# Patient Record
Sex: Female | Born: 1960 | Race: White | Hispanic: No | Marital: Single | Smoking: Never smoker
Health system: Southern US, Community
[De-identification: ages and names within clinical notes are randomized; demographics above are authoritative.]

## PROBLEM LIST (undated history)

## (undated) DIAGNOSIS — F419 Anxiety disorder, unspecified: Secondary | ICD-10-CM

## (undated) DIAGNOSIS — I10 Essential (primary) hypertension: Secondary | ICD-10-CM

## (undated) DIAGNOSIS — F909 Attention-deficit hyperactivity disorder, unspecified type: Secondary | ICD-10-CM

## (undated) DIAGNOSIS — M797 Fibromyalgia: Secondary | ICD-10-CM

## (undated) DIAGNOSIS — K219 Gastro-esophageal reflux disease without esophagitis: Secondary | ICD-10-CM

## (undated) DIAGNOSIS — M1990 Unspecified osteoarthritis, unspecified site: Secondary | ICD-10-CM

## (undated) HISTORY — PX: DILATION AND CURETTAGE OF UTERUS: SHX78

---

## 2005-05-31 ENCOUNTER — Emergency Department: Payer: Self-pay | Admitting: Unknown Physician Specialty

## 2006-05-07 ENCOUNTER — Ambulatory Visit: Payer: Self-pay | Admitting: Family Medicine

## 2007-01-01 ENCOUNTER — Emergency Department: Payer: Self-pay | Admitting: Emergency Medicine

## 2007-01-01 ENCOUNTER — Other Ambulatory Visit: Payer: Self-pay

## 2007-07-12 ENCOUNTER — Ambulatory Visit: Payer: Self-pay | Admitting: Family Medicine

## 2009-04-19 ENCOUNTER — Ambulatory Visit: Payer: Self-pay | Admitting: Family Medicine

## 2012-04-21 ENCOUNTER — Ambulatory Visit: Payer: Self-pay | Admitting: Family Medicine

## 2012-07-30 ENCOUNTER — Ambulatory Visit: Payer: Self-pay | Admitting: Unknown Physician Specialty

## 2013-05-11 ENCOUNTER — Ambulatory Visit: Payer: Self-pay | Admitting: Family Medicine

## 2014-06-01 ENCOUNTER — Ambulatory Visit: Payer: Self-pay | Admitting: Family Medicine

## 2014-07-07 ENCOUNTER — Ambulatory Visit: Payer: Self-pay | Admitting: Family Medicine

## 2015-06-05 ENCOUNTER — Other Ambulatory Visit: Payer: Self-pay | Admitting: Family Medicine

## 2015-06-05 DIAGNOSIS — Z1231 Encounter for screening mammogram for malignant neoplasm of breast: Secondary | ICD-10-CM

## 2015-07-10 ENCOUNTER — Ambulatory Visit
Admission: RE | Admit: 2015-07-10 | Discharge: 2015-07-10 | Disposition: A | Discharge status: Home / Self Care | Payer: BLUE CROSS/BLUE SHIELD | Source: Ambulatory Visit | Attending: Family Medicine | Admitting: Family Medicine

## 2015-07-10 DIAGNOSIS — Z1231 Encounter for screening mammogram for malignant neoplasm of breast: Secondary | ICD-10-CM | POA: Insufficient documentation

## 2016-12-16 ENCOUNTER — Other Ambulatory Visit: Payer: Self-pay | Admitting: Family Medicine

## 2016-12-16 DIAGNOSIS — Z1231 Encounter for screening mammogram for malignant neoplasm of breast: Secondary | ICD-10-CM

## 2017-01-06 ENCOUNTER — Ambulatory Visit
Admission: RE | Admit: 2017-01-06 | Discharge: 2017-01-06 | Disposition: A | Discharge status: Home / Self Care | Payer: Managed Care, Other (non HMO) | Source: Ambulatory Visit | Attending: Family Medicine | Admitting: Family Medicine

## 2017-01-06 DIAGNOSIS — Z1231 Encounter for screening mammogram for malignant neoplasm of breast: Secondary | ICD-10-CM | POA: Insufficient documentation

## 2017-04-16 ENCOUNTER — Encounter: Payer: Self-pay | Admitting: Emergency Medicine

## 2017-04-16 ENCOUNTER — Ambulatory Visit
Admission: EM | Admit: 2017-04-16 | Discharge: 2017-04-16 | Disposition: A | Discharge status: Home / Self Care | Payer: Worker's Compensation | Attending: Family Medicine | Admitting: Family Medicine

## 2017-04-16 DIAGNOSIS — S0990XA Unspecified injury of head, initial encounter: Secondary | ICD-10-CM | POA: Diagnosis not present

## 2017-04-16 HISTORY — DX: Attention-deficit hyperactivity disorder, unspecified type: F90.9

## 2017-04-16 HISTORY — DX: Essential (primary) hypertension: I10

## 2017-04-16 HISTORY — DX: Anxiety disorder, unspecified: F41.9

## 2017-04-16 MED ORDER — TETANUS-DIPHTH-ACELL PERTUSSIS 5-2.5-18.5 LF-MCG/0.5 IM SUSP
0.5000 mL | Freq: Once | INTRAMUSCULAR | Status: AC
  Administered 2017-04-16: 0.5 mL via INTRAMUSCULAR

## 2017-04-16 NOTE — ED Triage Notes (Signed)
Patient in today due to hitting head at work today. Patient bent down and when came up she hit head on the corner of the counter. Last tetanus ~7 years ago.

## 2017-04-16 NOTE — ED Provider Notes (Signed)
[facility]    CSN: [id number] Arrival date & time: 04/16/17  1710  History   Chief Complaint Chief Complaint  Patient presents with  . Head Laceration   HPI  56 year old female presented to the head injury.  Patient was at work earlier this afternoon and hit her head on a corner countertop. Occurred at 2:15 this afternoon. She had some bleeding but was controlled. Her tetanus is not up-to-date and her work sent her over for evaluation. She has no complaints at this time. No headache no vision changes no reports of loss of consciousness. She states that she feels well. She states that she just needs her tetanus and would like to go home. No medications taken. No other complaints at this time.  Past Medical History:  Diagnosis Date  . ADHD   . Anxiety   . Hypertension    History reviewed. No pertinent surgical history.  OB History    No data available     Home Medications    Prior to Admission medications   Medication Sig Start Date End Date Taking? Authorizing Provider  ALPRAZolam Prudy Feeler) 0.25 MG tablet Take 0.25 mg by mouth daily as needed for anxiety.   Yes [provider]  amphetamine-dextroamphetamine (ADDERALL XR) 30 MG 24 hr capsule Take 30 mg by mouth daily.   Yes [provider]  atenolol (TENORMIN) 50 MG tablet Take 50 mg by mouth daily.   Yes [provider]   Family History Family History  Problem Relation Age of Onset  . Breast cancer Maternal Aunt 70  . Cancer Mother        leukemia  . Hypertension Father   . CAD Father    Social History Social History  Substance Use Topics  . Smoking status: Never Smoker  . Smokeless tobacco: Never Used  . Alcohol use No   Allergies   Ibuprofen   Review of Systems Review of Systems  HENT:       Head injury.  All other systems reviewed and are negative. Physical Exam Triage Vital Signs ED Triage Vitals  Enc Vitals Group     BP 04/16/17 1742 130/80     Pulse Rate  04/16/17 1742 69     Resp 04/16/17 1742 16     Temp 04/16/17 1742 97.9 F (36.6 C)     Temp Source 04/16/17 1742 Oral     SpO2 04/16/17 1742 100 %     Weight 04/16/17 1742 155 lb 3.2 oz (70.4 kg)     Height 04/16/17 1742 5\' 4"  (1.626 m)     Head Circumference --      Peak Flow --      Pain Score 04/16/17 1743 2     Pain Loc --      Pain Edu? --      Excl. in GC? --    Updated Vital Signs BP 130/80 (BP Location: Left Arm)   Pulse 69   Temp 97.9 F (36.6 C) (Oral)   Resp 16   Ht 5\' 4"  (1.626 m)   Wt 155 lb 3.2 oz (70.4 kg)   SpO2 100%   BMI 26.64 kg/m     Physical Exam  Constitutional: She is oriented to person, place, and time. She appears well-developed and well-nourished. No distress.  HENT:  Head: Normocephalic.  Nose: Nose normal.  Mouth/Throat: Oropharynx is clear and moist. No oropharyngeal exudate.  Normal TM's bilaterally.  Patient has a small superficial cut. No bleeding currently.  Eyes: Conjunctivae are normal. No scleral icterus.  Neck: Neck supple. No thyromegaly present.  Cardiovascular: Normal rate and regular rhythm.   No murmur heard. Pulmonary/Chest: Effort normal and breath sounds normal. She has no wheezes. She has no rales.  Abdominal: Soft. She exhibits no distension. There is no tenderness. There is no rebound and no guarding.  Musculoskeletal: Normal range of motion. She exhibits no edema.  Lymphadenopathy:    She has no cervical adenopathy.  Neurological: She is alert and oriented to person, place, and time.  Skin: Skin is warm and dry. No rash noted.  Psychiatric: She has a normal mood and affect.  Vitals reviewed.  UC Treatments / Results  Labs (all labs ordered are listed, but only abnormal results are displayed) Labs Reviewed - No data to display  EKG  EKG Interpretation None       Radiology No results found.  Procedures Procedures (including critical care time)  Medications Ordered in UC Medications  Tdap (BOOSTRIX)  injection 0.5 mL (0.5 mLs Intramuscular Given 04/16/17 1757)     Initial Impression / Assessment and Plan / UC Course  I have reviewed the triage vital signs and the nursing notes.  Pertinent labs & imaging results that were available during my care of the patient were reviewed by me and considered in my medical decision making (see chart for details).  56 year old female presented with a minor head injury. No red flags. No signs or symptoms of concussion. Her small cut is very superficial and is not bleeding. Well approximated. Does not need sutures. Tdap given today. Over-the-counter Tylenol as needed.  Final Clinical Impressions(s) / UC Diagnoses   Final diagnoses:  Minor head injury, initial encounter   New Prescriptions Discharge Medication List as of 04/16/2017  6:11 PM     Controlled Substance Prescriptions [place] Controlled Substance Registry consulted? Not Applicable   Tommie SamsCook, Maiya Kates G, DO 04/16/17 Paulo [id number]

## 2017-04-16 NOTE — Discharge Instructions (Signed)
Tylenol as needed.   Take care  Dr. Maylin Freeburg  

## 2017-09-15 ENCOUNTER — Other Ambulatory Visit: Payer: Self-pay

## 2017-09-15 ENCOUNTER — Encounter
Admission: RE | Admit: 2017-09-15 | Discharge: 2017-09-15 | Disposition: A | Discharge status: Home / Self Care | Payer: BLUE CROSS/BLUE SHIELD | Source: Ambulatory Visit | Attending: Orthopedic Surgery | Admitting: Orthopedic Surgery

## 2017-09-15 HISTORY — DX: Fibromyalgia: M79.7

## 2017-09-15 HISTORY — DX: Gastro-esophageal reflux disease without esophagitis: K21.9

## 2017-09-15 HISTORY — DX: Unspecified osteoarthritis, unspecified site: M19.90

## 2017-09-15 NOTE — Patient Instructions (Signed)
Your procedure is scheduled on: 09-22-17 TUESDAY Report to Same Day Surgery 2nd floor medical mall [facility] Entrance-take elevator on left to 2nd floor.  Check in with surgery information desk.) To find out your arrival time please call [phone] between 1PM - 3PM on 09-21-17 MONDAY  Remember: Instructions that are not followed completely may result in serious medical risk, up to and including death, or upon the discretion of your surgeon and anesthesiologist your surgery may need to be rescheduled.    _x___ 1. Do not eat food after midnight the night before your procedure. NO GUM OR CANDY AFTER MIDNIGHT.  You may drink clear liquids up to 2 hours before you are scheduled to arrive at the hospital for your procedure.  Do not drink clear liquids within 2 hours of your scheduled arrival to the hospital.  Clear liquids include  --Water or Apple juice without pulp  --Clear carbohydrate beverage such as ClearFast or Gatorade  --Black Coffee or Clear Tea (No milk, no creamers, do not add anything to the coffee or Tea     __x__ 2. No Alcohol for 24 hours before or after surgery.   __x__3. No Smoking or e-cigarettes for 24 prior to surgery.  Do not use any chewable tobacco products for at least 6 hour prior to surgery   ____  4. Bring all medications with you on the day of surgery if instructed.    __x__ 5. Notify your doctor if there is any change in your medical condition     (cold, fever, infections).    x___6. On the morning of surgery brush your teeth with toothpaste and water.  You may rinse your mouth with mouth wash if you wish.  Do not swallow any toothpaste or mouthwash.   Do not wear jewelry, make-up, hairpins, clips or nail polish.  Do not wear lotions, powders, or perfumes. You may wear deodorant.  Do not shave 48 hours prior to surgery. Men may shave face and neck.  Do not bring valuables to the hospital.    [facility] is not responsible for any belongings or  valuables.               Contacts, dentures or bridgework may not be worn into surgery.  Leave your suitcase in the car. After surgery it may be brought to your room.  For patients admitted to the hospital, discharge time is determined by your treatment team.  _  Patients discharged the day of surgery will not be allowed to drive home.  You will need someone to drive you home and stay with you the night of your procedure.    Please read over the following fact sheets that you were given:   [facility] Preparing for Surgery and or MRSA Information   _x___ TAKE THE FOLLOWING MEDICATION THE MORNING OF SURGERY WITH A SMALL SIP OF WATER. These include:  1. ATENOLOL  2. OMEPRAZOLE (PRILOSEC)  3. TAKE AN OMEPRAZOLE THE NIGHT BEFORE SURGERY  4. YOU MAY TAKE A XANAX AM OF SURGERY IF NEEDED WITH A SMALL SIP OF WATER  5.  6.  ____Fleets enema or Magnesium Citrate as directed.   _x___ Use CHG Soap or sage wipes as directed on instruction sheet   ____ Use inhalers on the day of surgery and bring to hospital day of surgery  ____ Stop Metformin and Janumet 2 days prior to surgery.    ____ Take 1/2 of usual insulin dose the night before surgery and  none on the morning surgery.   ____ Follow recommendations from Cardiologist, Pulmonologist or PCP regarding stopping Aspirin, Coumadin, Plavix ,Eliquis, Effient, or Pradaxa, and Pletal.  X____Stop Anti-inflammatories such as Advil, Aleve, IBUPROFEN, Motrin, Naproxen, Naprosyn, Goodies powders or aspirin products NOW-OK to take Tylenol    ____ Stop supplements until after surgery.   ____ Bring C-Pap to the hospital.

## 2017-09-16 ENCOUNTER — Other Ambulatory Visit: Payer: Self-pay

## 2017-09-16 ENCOUNTER — Encounter
Admission: RE | Admit: 2017-09-16 | Discharge: 2017-09-16 | Disposition: A | Discharge status: Home / Self Care | Payer: BLUE CROSS/BLUE SHIELD | Source: Ambulatory Visit | Attending: Orthopedic Surgery | Admitting: Orthopedic Surgery

## 2017-09-16 DIAGNOSIS — I10 Essential (primary) hypertension: Secondary | ICD-10-CM | POA: Diagnosis not present

## 2017-09-16 DIAGNOSIS — Z01810 Encounter for preprocedural cardiovascular examination: Secondary | ICD-10-CM | POA: Insufficient documentation

## 2017-09-22 ENCOUNTER — Ambulatory Visit: Payer: BLUE CROSS/BLUE SHIELD | Admitting: Anesthesiology

## 2017-09-22 ENCOUNTER — Encounter: Payer: Self-pay | Admitting: *Deleted

## 2017-09-22 ENCOUNTER — Encounter
Admission: RE | Disposition: A | Discharge status: Home / Self Care | Payer: Self-pay | Source: Ambulatory Visit | Attending: Orthopedic Surgery

## 2017-09-22 ENCOUNTER — Ambulatory Visit
Admission: RE | Admit: 2017-09-22 | Discharge: 2017-09-22 | Disposition: A | Discharge status: Home / Self Care | Payer: BLUE CROSS/BLUE SHIELD | Source: Ambulatory Visit | Attending: Orthopedic Surgery | Admitting: Orthopedic Surgery

## 2017-09-22 DIAGNOSIS — F909 Attention-deficit hyperactivity disorder, unspecified type: Secondary | ICD-10-CM | POA: Insufficient documentation

## 2017-09-22 DIAGNOSIS — I10 Essential (primary) hypertension: Secondary | ICD-10-CM | POA: Diagnosis not present

## 2017-09-22 DIAGNOSIS — Z886 Allergy status to analgesic agent status: Secondary | ICD-10-CM | POA: Diagnosis not present

## 2017-09-22 DIAGNOSIS — Z8249 Family history of ischemic heart disease and other diseases of the circulatory system: Secondary | ICD-10-CM | POA: Diagnosis not present

## 2017-09-22 DIAGNOSIS — G5601 Carpal tunnel syndrome, right upper limb: Secondary | ICD-10-CM | POA: Diagnosis present

## 2017-09-22 DIAGNOSIS — M1990 Unspecified osteoarthritis, unspecified site: Secondary | ICD-10-CM | POA: Diagnosis not present

## 2017-09-22 DIAGNOSIS — M797 Fibromyalgia: Secondary | ICD-10-CM | POA: Insufficient documentation

## 2017-09-22 DIAGNOSIS — K219 Gastro-esophageal reflux disease without esophagitis: Secondary | ICD-10-CM | POA: Insufficient documentation

## 2017-09-22 DIAGNOSIS — F419 Anxiety disorder, unspecified: Secondary | ICD-10-CM | POA: Diagnosis not present

## 2017-09-22 DIAGNOSIS — Z885 Allergy status to narcotic agent status: Secondary | ICD-10-CM | POA: Insufficient documentation

## 2017-09-22 HISTORY — PX: CARPAL TUNNEL RELEASE: SHX101

## 2017-09-22 SURGERY — CARPAL TUNNEL RELEASE
Anesthesia: General | Site: Wrist | Laterality: Right | Wound class: Clean

## 2017-09-22 MED ORDER — DEXAMETHASONE SODIUM PHOSPHATE 10 MG/ML IJ SOLN
INTRAMUSCULAR | Status: DC | PRN
  Administered 2017-09-22: 5 mg via INTRAVENOUS

## 2017-09-22 MED ORDER — PROMETHAZINE HCL 25 MG/ML IJ SOLN: 6.2500 mg | INTRAMUSCULAR | Status: DC | PRN

## 2017-09-22 MED ORDER — PROPOFOL 10 MG/ML IV BOLUS
INTRAVENOUS | Status: AC
  Filled 2017-09-22: qty 20

## 2017-09-22 MED ORDER — FENTANYL CITRATE (PF) 100 MCG/2ML IJ SOLN: 25.0000 ug | INTRAMUSCULAR | Status: DC | PRN

## 2017-09-22 MED ORDER — BUPIVACAINE HCL (PF) 0.5 % IJ SOLN
INTRAMUSCULAR | Status: AC
  Filled 2017-09-22: qty 30

## 2017-09-22 MED ORDER — PHENYLEPHRINE HCL 10 MG/ML IJ SOLN
INTRAMUSCULAR | Status: DC | PRN
  Administered 2017-09-22: 50 ug via INTRAVENOUS

## 2017-09-22 MED ORDER — HYDROCODONE-ACETAMINOPHEN 5-325 MG PO TABS: 1.0000 | ORAL_TABLET | Freq: Four times a day (QID) | ORAL | 0 refills | Status: DC | PRN

## 2017-09-22 MED ORDER — BUPIVACAINE HCL 0.5 % IJ SOLN
INTRAMUSCULAR | Status: DC | PRN
  Administered 2017-09-22: 10 mL

## 2017-09-22 MED ORDER — LIDOCAINE HCL (PF) 1 % IJ SOLN
INTRAMUSCULAR | Status: AC
  Filled 2017-09-22: qty 2

## 2017-09-22 MED ORDER — ONDANSETRON HCL 4 MG/2ML IJ SOLN
INTRAMUSCULAR | Status: AC
  Filled 2017-09-22: qty 2

## 2017-09-22 MED ORDER — DEXAMETHASONE SODIUM PHOSPHATE 10 MG/ML IJ SOLN
INTRAMUSCULAR | Status: AC
  Filled 2017-09-22: qty 1

## 2017-09-22 MED ORDER — GLYCOPYRROLATE 0.2 MG/ML IJ SOLN
INTRAMUSCULAR | Status: DC | PRN
  Administered 2017-09-22: 0.2 mg via INTRAVENOUS

## 2017-09-22 MED ORDER — GLYCOPYRROLATE 0.2 MG/ML IJ SOLN
INTRAMUSCULAR | Status: AC
  Filled 2017-09-22: qty 1

## 2017-09-22 MED ORDER — FENTANYL CITRATE (PF) 100 MCG/2ML IJ SOLN
INTRAMUSCULAR | Status: AC
  Filled 2017-09-22: qty 2

## 2017-09-22 MED ORDER — PROPOFOL 10 MG/ML IV BOLUS
INTRAVENOUS | Status: DC | PRN
  Administered 2017-09-22: 160 mg via INTRAVENOUS

## 2017-09-22 MED ORDER — LIDOCAINE HCL (CARDIAC) 20 MG/ML IV SOLN
INTRAVENOUS | Status: DC | PRN
  Administered 2017-09-22: 60 mg via INTRAVENOUS

## 2017-09-22 MED ORDER — FENTANYL CITRATE (PF) 100 MCG/2ML IJ SOLN
INTRAMUSCULAR | Status: DC | PRN
  Administered 2017-09-22: 25 ug via INTRAVENOUS
  Administered 2017-09-22: 50 ug via INTRAVENOUS
  Administered 2017-09-22: 25 ug via INTRAVENOUS

## 2017-09-22 MED ORDER — MIDAZOLAM HCL 2 MG/2ML IJ SOLN
INTRAMUSCULAR | Status: AC
  Filled 2017-09-22: qty 2

## 2017-09-22 MED ORDER — LIDOCAINE HCL (PF) 2 % IJ SOLN
INTRAMUSCULAR | Status: AC
  Filled 2017-09-22: qty 10

## 2017-09-22 MED ORDER — MIDAZOLAM HCL 2 MG/2ML IJ SOLN
INTRAMUSCULAR | Status: DC | PRN
  Administered 2017-09-22: 2 mg via INTRAVENOUS

## 2017-09-22 MED ORDER — LACTATED RINGERS IV SOLN
INTRAVENOUS | Status: DC
  Administered 2017-09-22: 13:00:00 via INTRAVENOUS

## 2017-09-22 SURGICAL SUPPLY — 22 items
BANDAGE ACE 3X5.8 VEL STRL LF (GAUZE/BANDAGES/DRESSINGS) ×3 IMPLANT
CANISTER SUCT 1200ML W/VALVE (MISCELLANEOUS) ×3 IMPLANT
CHLORAPREP W/TINT 26ML (MISCELLANEOUS) ×3 IMPLANT
CUFF TOURN 18 STER (MISCELLANEOUS) IMPLANT
ELECT CAUTERY NEEDLE 2.0 MIC (NEEDLE) IMPLANT
GAUZE PETRO XEROFOAM 1X8 (MISCELLANEOUS) ×3 IMPLANT
GAUZE SPONGE 4X4 12PLY STRL (GAUZE/BANDAGES/DRESSINGS) ×3 IMPLANT
GLOVE SURG SYN 9.0  PF PI (GLOVE) ×2
GLOVE SURG SYN 9.0 PF PI (GLOVE) ×1 IMPLANT
GOWN SRG 2XL LVL 4 RGLN SLV (GOWNS) ×1 IMPLANT
GOWN STRL NON-REIN 2XL LVL4 (GOWNS) ×2
GOWN STRL REUS W/ TWL LRG LVL3 (GOWN DISPOSABLE) ×1 IMPLANT
GOWN STRL REUS W/TWL LRG LVL3 (GOWN DISPOSABLE) ×2
KIT TURNOVER KIT A (KITS) ×3 IMPLANT
NS IRRIG 500ML POUR BTL (IV SOLUTION) ×3 IMPLANT
PACK EXTREMITY ARMC (MISCELLANEOUS) ×3 IMPLANT
PAD CAST CTTN 4X4 STRL (SOFTGOODS) ×1 IMPLANT
PADDING CAST COTTON 4X4 STRL (SOFTGOODS) ×2
SCALPEL PROTECTED #15 DISP (BLADE) ×6 IMPLANT
SUT ETHILON 4-0 (SUTURE) ×2
SUT ETHILON 4-0 FS2 18XMFL BLK (SUTURE) ×1
SUTURE ETHLN 4-0 FS2 18XMF BLK (SUTURE) ×1 IMPLANT

## 2017-09-22 NOTE — Anesthesia Procedure Notes (Signed)
Procedure Name: LMA Insertion Date/Time: 09/22/2017 12:40 PM Performed by: Mathews ArgyleLogan, Rekha Hobbins, CRNA Pre-anesthesia Checklist: Patient identified, Patient being monitored, Timeout performed, Emergency Drugs available and Suction available Patient Re-evaluated:Patient Re-evaluated prior to induction Oxygen Delivery Method: Circle system utilized Preoxygenation: Pre-oxygenation with 100% oxygen Induction Type: IV induction Ventilation: Mask ventilation without difficulty LMA: LMA inserted LMA Size: 4.0 Tube type: Oral Number of attempts: 1 Placement Confirmation: positive ETCO2 and breath sounds checked- equal and bilateral Tube secured with: Tape Dental Injury: Teeth and Oropharynx as per pre-operative assessment

## 2017-09-22 NOTE — H&P (Signed)
Reviewed paper H+P, will be scanned into chart. No changes noted.  

## 2017-09-22 NOTE — Anesthesia Preprocedure Evaluation (Signed)
Anesthesia Evaluation  Patient identified by MRN, date of birth, ID band Patient awake    Reviewed: Allergy & Precautions, H&P , NPO status , Patient's Chart, lab work & pertinent test results  History of Anesthesia Complications Negative for: history of anesthetic complications  Airway Mallampati: III  TM Distance: >3 FB Neck ROM: full    Dental  (+) Chipped, Poor Dentition   Pulmonary neg pulmonary ROS, neg shortness of breath,           Cardiovascular Exercise Tolerance: Good hypertension, (-) angina(-) Past MI and (-) DOE      Neuro/Psych PSYCHIATRIC DISORDERS Anxiety  Neuromuscular disease    GI/Hepatic Neg liver ROS, GERD  Medicated and Controlled,  Endo/Other  negative endocrine ROS  Renal/GU      Musculoskeletal  (+) Arthritis , Fibromyalgia -  Abdominal   Peds  Hematology negative hematology ROS (+)   Anesthesia Other Findings Past Medical History: No date: ADHD No date: Anxiety No date: Arthritis No date: Fibromyalgia No date: GERD (gastroesophageal reflux disease) No date: Hypertension  Past Surgical History: No date: DILATION AND CURETTAGE OF UTERUS  BMI    Body Mass Index:  26.09 kg/m      Reproductive/Obstetrics negative OB ROS                             Anesthesia Physical Anesthesia Plan  ASA: III  Anesthesia Plan: General   Post-op Pain Management:    Induction: Intravenous  PONV Risk Score and Plan: Ondansetron, Dexamethasone and Midazolam  Airway Management Planned: LMA  Additional Equipment:   Intra-op Plan:   Post-operative Plan: Extubation in OR  Informed Consent: I have reviewed the patients History and Physical, chart, labs and discussed the procedure including the risks, benefits and alternatives for the proposed anesthesia with the patient or authorized representative who has indicated his/her understanding and acceptance.   Dental  Advisory Given  Plan Discussed with: Anesthesiologist, CRNA and Surgeon  Anesthesia Plan Comments: (Patient consented for risks of anesthesia including but not limited to:  - adverse reactions to medications - damage to teeth, lips or other oral mucosa - sore throat or hoarseness - Damage to heart, brain, lungs or loss of life  Patient voiced understanding.)        Anesthesia Quick Evaluation

## 2017-09-22 NOTE — Anesthesia Post-op Follow-up Note (Signed)
Anesthesia QCDR form completed.        

## 2017-09-22 NOTE — Discharge Instructions (Addendum)
AMBULATORY SURGERY  DISCHARGE INSTRUCTIONS   1) The drugs that you were given will stay in your system until tomorrow so for the next 24 hours you should not:  A) Drive an automobile B) Make any legal decisions C) Drink any alcoholic beverage   2) You may resume regular meals tomorrow.  Today it is better to start with liquids and gradually work up to solid foods.  You may eat anything you prefer, but it is better to start with liquids, then soup and crackers, and gradually work up to solid foods.   3) Please notify your doctor immediately if you have any unusual bleeding, trouble breathing, redness and pain at the surgery site, drainage, fever, or pain not relieved by medication. 4)   5) Your post-operative visit with Dr.                                     is: Date:                        Time:    Please call to schedule your post-operative visit.  6) Additional Instructions:      Keep arm elevated.  Work on finger range of motion.  Pain medicine as directed.  Loosen Ace wrap prior to discharge and if fingers swell Carpal Tunnel Release, Care After Refer to this sheet in the next few weeks. These instructions provide you with information about caring for yourself after your procedure. Your health care provider may also give you more specific instructions. Your treatment has been planned according to current medical practices, but problems sometimes occur. Call your health care provider if you have any problems or questions after your procedure. What can I expect after the procedure? After your procedure, it is typical to have the following:  Pain.  Numbness.  Tingling.  Swelling.  Stiffness.  Bruising.  Follow these instructions at home:  Take medicines only as directed by your health care provider.  There are many different ways to close and cover an incision, including stitches (sutures), skin glue, and adhesive strips. Follow your health care provider's  instructions about: ? Incision care. ? Bandage (dressing) changes and removal. ? Incision closure removal.  Wear a splint or a brace as directed by your surgeon. You may need to do this for 2-3 weeks.  Keep your hand raised (elevated) above the level of your heart while you are resting. Move your fingers often.  Avoid activities that cause hand pain.  Ask your surgeon when you can start to do all of your usual activities again, such as: ? Driving. ? Returning to work. ? Bathing and swimming.  Keep all follow-up visits as directed by your health care provider. This is important. You may need physical therapy for several months to speed healing and regain movement. Contact a health care provider if:  You have drainage, redness, swelling, or pain at your incision site.  You have a fever.  You have chills.  Your pain medicine is not working.  Your symptoms do not go away after 2 months.  Your symptoms go away and then return. Get help right away if:  You have pain or numbness that is getting worse.  Your fingers change color.  You are not able to move your fingers. This information is not intended to replace advice given to you by your health care provider. Make  sure you discuss any questions you have with your health care provider. Document Released: 01/03/2005 Document Revised: 11/22/2015 Document Reviewed: 02/01/2014 Elsevier Interactive Patient Education  2018 [employer]

## 2017-09-22 NOTE — Anesthesia Postprocedure Evaluation (Signed)
Anesthesia Post Note  Patient: Cindie Crumblyatricia G Deerman  Procedure(s) Performed: CARPAL TUNNEL RELEASE (Right Wrist)  Patient location during evaluation: PACU Anesthesia Type: General Level of consciousness: awake and alert Pain management: pain level controlled Vital Signs Assessment: post-procedure vital signs reviewed and stable Respiratory status: spontaneous breathing, nonlabored ventilation, respiratory function stable and patient connected to nasal cannula oxygen Cardiovascular status: blood pressure returned to baseline and stable Postop Assessment: no apparent nausea or vomiting Anesthetic complications: no     Last Vitals:  Vitals:   09/22/17 1351 09/22/17 1415  BP: (!) 112/48 101/61  Pulse: 95 76  Resp: 16 16  Temp: (!) 36.1 C   SpO2: 99% 98%    Last Pain:  Vitals:   09/22/17 1415  TempSrc:   PainSc: 1                  Cleda MccreedyJoseph K Piscitello

## 2017-09-22 NOTE — Transfer of Care (Signed)
Immediate Anesthesia Transfer of Care Note  Patient: Felicia Meyer  Procedure(s) Performed: CARPAL TUNNEL RELEASE (Right Wrist)  Patient Location: PACU  Anesthesia Type:General  Level of Consciousness: sedated  Airway & Oxygen Therapy: Patient Spontanous Breathing and Patient connected to face mask oxygen  Post-op Assessment: Report given to RN and Post -op Vital signs reviewed and stable  Post vital signs: Reviewed and stable  Last Vitals:  Vitals Value Taken Time  BP 86/61 09/22/2017  1:10 PM  Temp 36 C 09/22/2017  1:10 PM  Pulse 71 09/22/2017  1:10 PM  Resp 18 09/22/2017  1:10 PM  SpO2 100 % 09/22/2017  1:10 PM  Vitals shown include unvalidated device data.  Last Pain:  Vitals:   09/22/17 1110  PainSc: 6          Complications: No apparent anesthesia complications

## 2017-09-22 NOTE — Op Note (Signed)
09/22/2017  1:09 PM  PATIENT:  Felicia CrumblyPatricia G Meyer  57 y.o. female  PRE-OPERATIVE DIAGNOSIS:  CARPAL TUNNEL SYNDROME RIGHT  POST-OPERATIVE DIAGNOSIS:  CARPAL TUNNEL SYNDROME RIGHT  PROCEDURE:  Procedure(s): CARPAL TUNNEL RELEASE (Right)  SURGEON: Leitha SchullerMichael J Ray Glacken, MD  ASSISTANTS: None  ANESTHESIA:   general  EBL:  No intake/output data recorded.  BLOOD ADMINISTERED:none  DRAINS: none   LOCAL MEDICATIONS USED:  MARCAINE     SPECIMEN:  No Specimen  DISPOSITION OF SPECIMEN:  N/A  COUNTS:  YES  TOURNIQUET:  * Missing tourniquet times found for documented tourniquets in log: [id number] *10 minutes at 250 mmHg  IMPLANTS: None  DICTATION: .Dragon Dictation patient brought the operating room and after adequate anesthesia was obtained the right arm was prepped and draped in sterile fashion.  After patient identification and timeout procedures were completed approximately 2 cm incision was made in line with the ring metacarpal starting at the distal wrist flexion crease.  Skin and subcutaneous tissue were separated and the transverse carpal ligament identified with some apparent thenar musculature overlying the ligament on the ulnar side.  This was elevated off the transcarpal ligament and the ligament opened with a vascular hemostat placed deep to protect the underlying structures.  Release carried out distally until fat was noted around the nerve and then proximally until there is good vascular blush there appeared to be constriction at the proximal end of the canal at the level of the wrist flexion crease after release there was good vascular blush to the nerve there is moderate flexor tenosynovitis present no masses.  The wound was irrigated and then closed after infiltrating 10 cc of half percent Sensorcaine plain with 4-0 nylon followed by Xeroform 4 x 4 web roll and Ace wrap  PLAN OF CARE: Discharge to home after PACU  PATIENT DISPOSITION:  PACU - hemodynamically stable.

## 2017-09-23 ENCOUNTER — Encounter: Payer: Self-pay | Admitting: Orthopedic Surgery

## 2018-03-02 ENCOUNTER — Other Ambulatory Visit: Payer: Self-pay | Admitting: Family Medicine

## 2018-03-02 ENCOUNTER — Ambulatory Visit
Admission: RE | Admit: 2018-03-02 | Discharge: 2018-03-02 | Disposition: A | Discharge status: Home / Self Care | Payer: BLUE CROSS/BLUE SHIELD | Source: Ambulatory Visit | Attending: Family Medicine | Admitting: Family Medicine

## 2018-03-02 DIAGNOSIS — Z1231 Encounter for screening mammogram for malignant neoplasm of breast: Secondary | ICD-10-CM

## 2018-12-14 ENCOUNTER — Encounter: Payer: Self-pay | Admitting: Emergency Medicine

## 2018-12-14 ENCOUNTER — Ambulatory Visit
Admission: EM | Admit: 2018-12-14 | Discharge: 2018-12-14 | Disposition: A | Discharge status: Home / Self Care | Payer: BLUE CROSS/BLUE SHIELD | Attending: Emergency Medicine | Admitting: Emergency Medicine

## 2018-12-14 ENCOUNTER — Other Ambulatory Visit: Payer: Self-pay

## 2018-12-14 DIAGNOSIS — R350 Frequency of micturition: Secondary | ICD-10-CM | POA: Diagnosis not present

## 2018-12-14 DIAGNOSIS — R319 Hematuria, unspecified: Secondary | ICD-10-CM

## 2018-12-14 DIAGNOSIS — N390 Urinary tract infection, site not specified: Secondary | ICD-10-CM

## 2018-12-14 LAB — URINALYSIS, COMPLETE (UACMP) WITH MICROSCOPIC
Bilirubin Urine: NEGATIVE
Glucose, UA: NEGATIVE mg/dL
Ketones, ur: NEGATIVE mg/dL
Nitrite: POSITIVE — AB
Protein, ur: NEGATIVE mg/dL
Specific Gravity, Urine: 1.015 (ref 1.005–1.030)
Squamous Epithelial / LPF: NONE SEEN (ref 0–5)
WBC, UA: >50 WBC/hpf (ref 0–5)
pH: 6 (ref 5.0–8.0)

## 2018-12-14 MED ORDER — CEPHALEXIN 500 MG PO CAPS: 500.0000 mg | ORAL_CAPSULE | Freq: Two times a day (BID) | ORAL | 0 refills | Status: AC

## 2018-12-14 MED ORDER — FLUCONAZOLE 150 MG PO TABS: 150.0000 mg | ORAL_TABLET | Freq: Once | ORAL | 0 refills | Status: AC

## 2018-12-14 NOTE — ED Provider Notes (Signed)
[facility] ____________________________________________  Time seen: Approximately 11:21 AM  I have reviewed the triage vital signs and the nursing notes.   HISTORY  Chief Complaint Dysuria   HPI Felicia Meyer is a 58 y.o. female seen for evaluation of 2 days of urinary frequency, urinary urgency and burning with urination.  Some suprapubic pressure.  Denies abdominal pain or back pain otherwise.  Denies accompanying fevers, vomiting, diarrhea.  Continues to eat and drink well.  Denies known triggers.  History of similar but not recurrently.  Denies recent sickness or recent antibiotic use.  Denies chest pain or shortness of breath, cough or recent fevers.  Did take some over-the-counter urinary relief without resolution.  Denies other alleviating measures.    Past Medical History:  Diagnosis Date  . ADHD   . Anxiety   . Arthritis   . Fibromyalgia   . GERD (gastroesophageal reflux disease)   . Hypertension     There are no active problems to display for this patient.   Past Surgical History:  Procedure Laterality Date  . CARPAL TUNNEL RELEASE Right 09/22/2017   Procedure: CARPAL TUNNEL RELEASE;  Surgeon: Hessie Knows, MD;  Location: [facility] ORS;  Service: Orthopedics;  Laterality: Right;  . DILATION AND CURETTAGE OF UTERUS       No current facility-administered medications for this encounter.   Current Outpatient Medications:  .  acetaminophen (TYLENOL) 500 MG tablet, Take 500-1,000 mg by mouth every 6 (six) hours as needed (for pain.)., Disp: , Rfl:  .  ALPRAZolam (XANAX) 0.25 MG tablet, Take 0.25 mg by mouth daily as needed for anxiety., Disp: , Rfl:  .  amphetamine-dextroamphetamine (ADDERALL XR) 25 MG 24 hr capsule, Take 25 mg by mouth daily., Disp: , Rfl:  .  atenolol (TENORMIN) 50 MG tablet, Take 50 mg by mouth every morning. , Disp: , Rfl:  .  ibuprofen (ADVIL,MOTRIN) 200 MG tablet, Take 600 mg by mouth every 8 (eight) hours as needed (for pain.).,  Disp: , Rfl:  .  omeprazole (PRILOSEC) 20 MG capsule, Take 20 mg by mouth as needed., Disp: , Rfl:  .  cephALEXin (KEFLEX) 500 MG capsule, Take 1 capsule (500 mg total) by mouth 2 (two) times daily for 7 days., Disp: 14 capsule, Rfl: 0 .  fluconazole (DIFLUCAN) 150 MG tablet, Take 1 tablet (150 mg total) by mouth once for 1 dose. Take one pill orally as needed, Disp: 1 tablet, Rfl: 0 .  HYDROcodone-acetaminophen (NORCO) 5-325 MG tablet, Take 1-2 tablets by mouth every 6 (six) hours as needed for moderate pain., Disp: 30 tablet, Rfl: 0  Allergies Ibuprofen, Lactose intolerance (gi), and Codeine  Family History  Problem Relation Age of Onset  . Breast cancer Maternal Aunt 70  . Cancer Mother        leukemia  . Hypertension Father   . CAD Father     Social History Social History   Tobacco Use  . Smoking status: Never Smoker  . Smokeless tobacco: Never Used  Substance Use Topics  . Alcohol use: No  . Drug use: No    Review of Systems Constitutional: No fever Cardiovascular: Denies chest pain. Respiratory: Denies shortness of breath. Gastrointestinal: No abdominal pain.  No nausea, no vomiting.  No diarrhea.   Genitourinary: Positive for dysuria. Musculoskeletal: Negative for back pain. Skin: Negative for rash. Neurological: Negative for headaches, focal weakness or numbness.    ____________________________________________   PHYSICAL EXAM:  VITAL SIGNS: ED Triage Vitals [12/14/18 1013]  Enc Vitals Group     BP (!) 133/94     Pulse Rate 91     Resp 18     Temp 98.2 F (36.8 C)     Temp Source Oral     SpO2 100 %     Weight 164 lb (74.4 kg)     Height 5\' 4"  (1.626 m)     Head Circumference      Peak Flow      Pain Score 7     Pain Loc      Pain Edu?      Excl. in GC?     Constitutional: Alert and oriented. Well appearing and in no acute distress. ENT      Head: Normocephalic and atraumatic. Cardiovascular: Normal rate, regular rhythm. Grossly normal heart  sounds.  Good peripheral circulation. Respiratory: Normal respiratory effort without tachypnea nor retractions. Breath sounds are clear and equal bilaterally. No wheezes, rales, rhonchi. Gastrointestinal: Minimal midline suprapubic tenderness palpation.  Abdomen otherwise soft nontender.  No CVA tenderness. Musculoskeletal: No midline cervical, thoracic or lumbar tenderness to palpation. Neurologic:  Normal speech and language.  Speech is normal. No gait instability.  Skin:  Skin is warm, dry and intact. No rash noted. Psychiatric: Mood and affect are normal. Speech and behavior are normal. Patient exhibits appropriate insight and judgment   ___________________________________________   LABS (all labs ordered are listed, but only abnormal results are displayed)  Labs Reviewed  URINALYSIS, COMPLETE (UACMP) WITH MICROSCOPIC - Abnormal; Notable for the following components:      Result Value   APPearance CLOUDY (*)    Hgb urine dipstick MODERATE (*)    Nitrite POSITIVE (*)    Leukocytes,Ua MODERATE (*)    Bacteria, UA FEW (*)    All other components within normal limits  URINE CULTURE     PROCEDURES Procedures     INITIAL IMPRESSION / ASSESSMENT AND PLAN / ED COURSE  Pertinent labs & imaging results that were available during my care of the patient were reviewed by me and considered in my medical decision making (see chart for details).  Well-appearing patient.  No acute distress.  Urinalysis reviewed, suspect UTI.  We will culture.  Will empirically treat with oral Keflex.  Patient requests Rx for Diflucan as needed for yeast vaginitis, Rx given.  No stress, fluids, supportive care and monitoring.Discussed indication, risks and benefits of medications with patient.  Discussed follow up with Primary care physician this week. Discussed follow up and return parameters including no resolution or any worsening concerns. Patient verbalized understanding and agreed to plan.    ____________________________________________   FINAL CLINICAL IMPRESSION(S) / ED DIAGNOSES  Final diagnoses:  Urinary tract infection with hematuria, site unspecified     ED Discharge Orders         Ordered    cephALEXin (KEFLEX) 500 MG capsule  2 times daily     12/14/18 1053    fluconazole (DIFLUCAN) 150 MG tablet   Once     12/14/18 1053           Note: This dictation was prepared with Dragon dictation along with smaller phrase technology. Any transcriptional errors that result from this process are unintentional.         Renford DillsMiller, Ianmichael Amescua, NP 12/14/18 1123

## 2018-12-14 NOTE — Discharge Instructions (Addendum)
Take medication as prescribed. Rest. Drink plenty of fluids.  ° °Follow up with your primary care physician this week as needed. Return to Urgent care for new or worsening concerns.  ° °

## 2018-12-14 NOTE — ED Triage Notes (Signed)
Patient c/o dysuria that started on Sunday. She has been taking OTC Pyridium.

## 2018-12-16 LAB — URINE CULTURE: Culture: >=100000 — AB

## 2018-12-17 ENCOUNTER — Telehealth (HOSPITAL_COMMUNITY): Payer: Self-pay | Admitting: Emergency Medicine

## 2018-12-17 NOTE — Telephone Encounter (Signed)
Urine culture was positive for e coli and was given keflex  at urgent care visit. Attempted to reach patient. No answer at this time.   

## 2019-03-09 ENCOUNTER — Encounter: Payer: Self-pay | Admitting: Emergency Medicine

## 2019-03-09 ENCOUNTER — Ambulatory Visit
Admission: EM | Admit: 2019-03-09 | Discharge: 2019-03-09 | Disposition: A | Discharge status: Home / Self Care | Payer: BLUE CROSS/BLUE SHIELD | Attending: Family Medicine | Admitting: Family Medicine

## 2019-03-09 ENCOUNTER — Ambulatory Visit (INDEPENDENT_AMBULATORY_CARE_PROVIDER_SITE_OTHER)
Admit: 2019-03-09 | Discharge: 2019-03-09 | Disposition: A | Discharge status: Home / Self Care | Payer: BLUE CROSS/BLUE SHIELD

## 2019-03-09 ENCOUNTER — Other Ambulatory Visit: Payer: Self-pay

## 2019-03-09 DIAGNOSIS — M25552 Pain in left hip: Secondary | ICD-10-CM | POA: Diagnosis not present

## 2019-03-09 DIAGNOSIS — M79652 Pain in left thigh: Secondary | ICD-10-CM

## 2019-03-09 MED ORDER — MELOXICAM 15 MG PO TABS: 15.0000 mg | ORAL_TABLET | Freq: Every day | ORAL | 0 refills | Status: DC | PRN

## 2019-03-09 NOTE — ED Triage Notes (Signed)
Pt c/o pain in her left leg. She went to [facility] on 03/03/19 and was given Flexeril but she says it is not working. She states that they did not even examine her leg. She states that she can not apply her full weight to her leg. Pain is located on her upper thigh. No known injury.

## 2019-03-09 NOTE — Discharge Instructions (Signed)
Take medication as prescribed. Rest. Drink plenty of fluids. Ice. Heat.   Follow up with orthopedic for continued pain.   Follow up with your primary care physician this week as needed. Return to Urgent care for new or worsening concerns.

## 2019-03-09 NOTE — ED Provider Notes (Signed)
[facility] ____________________________________________  Time seen: Approximately 10:55 AM  I have reviewed the triage vital signs and the nursing notes.  HISTORY  Chief Complaint Leg Pain (left)  HPI Felicia Meyer is a 58 y.o. female present for evaluation of left thigh pain present for approximately 2 weeks.  Patient reports pain is worse with direct palpation as well as movement but also present at rest.  States she feels like the area is somewhat tight and swollen.  Denies any rash, redness, skin changes.  Denies any fall, known injury or direct trauma.  Denies pain radiation or paresthesias.  States pain is only to the mid thigh and not elsewhere.  Denies chest pain or shortness of breath, abdominal pain, dysuria, hip pain, knee pain or history of similar.  Denies recent immobilization.  Has continued to tolerate weightbearing.  Patient states that she was seen a week ago at [facility] ER for abdominal pain and also mentioned her leg pain and was given Flexeril which has not helped.  Also has been alternating Tylenol and advil.  Denies other aggravating or alleviating factors.  No history of same.   Past Medical History:  Diagnosis Date  . ADHD   . Anxiety   . Arthritis   . Fibromyalgia   . GERD (gastroesophageal reflux disease)   . Hypertension     There are no active problems to display for this patient.   Past Surgical History:  Procedure Laterality Date  . CARPAL TUNNEL RELEASE Right 09/22/2017   Procedure: CARPAL TUNNEL RELEASE;  Surgeon: Kennedy Bucker, MD;  Location: [facility] ORS;  Service: Orthopedics;  Laterality: Right;  . DILATION AND CURETTAGE OF UTERUS       No current facility-administered medications for this encounter.   Current Outpatient Medications:  .  acetaminophen (TYLENOL) 500 MG tablet, Take 500-1,000 mg by mouth every 6 (six) hours as needed (for pain.)., Disp: , Rfl:  .  ALPRAZolam (XANAX) 0.25 MG tablet, Take 0.25 mg by mouth daily as  needed for anxiety., Disp: , Rfl:  .  amphetamine-dextroamphetamine (ADDERALL XR) 25 MG 24 hr capsule, Take 25 mg by mouth daily., Disp: , Rfl:  .  atenolol (TENORMIN) 50 MG tablet, Take 50 mg by mouth every morning. , Disp: , Rfl:  .  ibuprofen (ADVIL,MOTRIN) 200 MG tablet, Take 600 mg by mouth every 8 (eight) hours as needed (for pain.)., Disp: , Rfl:  .  omeprazole (PRILOSEC) 20 MG capsule, Take 20 mg by mouth as needed., Disp: , Rfl:  .  meloxicam (MOBIC) 15 MG tablet, Take 1 tablet (15 mg total) by mouth daily as needed., Disp: 10 tablet, Rfl: 0  Allergies Ibuprofen, Lactose intolerance (gi), and Codeine  Family History  Problem Relation Age of Onset  . Breast cancer Maternal Aunt 70  . Cancer Mother        leukemia  . Hypertension Father   . CAD Father     Social History Social History   Tobacco Use  . Smoking status: Never Smoker  . Smokeless tobacco: Never Used  Substance Use Topics  . Alcohol use: No  . Drug use: No    Review of Systems Constitutional: No fever ENT: No sore throat. Cardiovascular: Denies chest pain. Respiratory: Denies shortness of breath. Gastrointestinal: No abdominal pain.  No nausea, no vomiting.  No diarrhea.  No constipation. Genitourinary: Negative for dysuria. Musculoskeletal: Negative for back pain.  Positive left leg pain. Skin: Negative for rash. Neurological: Negative for headaches, focal weakness  or numbness.   ____________________________________________   PHYSICAL EXAM:  VITAL SIGNS: ED Triage Vitals  Enc Vitals Group     BP 03/09/19 1017 (!) 152/97     Pulse Rate 03/09/19 1017 77     Resp 03/09/19 1017 18     Temp 03/09/19 1017 98.2 F (36.8 C)     Temp Source 03/09/19 1017 Oral     SpO2 03/09/19 1017 99 %     Weight 03/09/19 1013 163 lb (73.9 kg)     Height 03/09/19 1013 5\' 4"  (1.626 m)     Head Circumference --      Peak Flow --      Pain Score 03/09/19 1012 8     Pain Loc --      Pain Edu? --      Excl. in  GC? --     Constitutional: Alert and oriented. Well appearing and in no acute distress. Eyes: Conjunctivae are normal.  ENT      Head: Normocephalic and atraumatic. Cardiovascular: Normal rate, regular rhythm. Grossly normal heart sounds.  Good peripheral circulation. Respiratory: Normal respiratory effort without tachypnea nor retractions. Breath sounds are clear and equal bilaterally. No wheezes, rales, rhonchi. Gastrointestinal: Soft and nontender.No CVA tenderness. Musculoskeletal:   No midline cervical, thoracic or lumbar tenderness to palpation. Bilateral pedal pulses equal and easily palpated. Except: Right mid thigh dorsal aspect along quadriceps tenderness to direct palpation, varicose veins present without erythema, minimal edema, no bony tenderness, full range of motion present, pain with quadricep engagement when standing, no posterior leg pain, no left lower leg pain, normal distal sensation, no paresthesias. Neurologic:  Normal speech and language. No gross focal neurologic deficits are appreciated. Speech is normal. No gait instability.  Skin:  Skin is warm, dry and intact. No rash noted. Psychiatric: Mood and affect are normal. Speech and behavior are normal. Patient exhibits appropriate insight and judgment   ___________________________________________   LABS (all labs ordered are listed, but only abnormal results are displayed)  Labs Reviewed - No data to display ____________________________________________  RADIOLOGY  [place] Venous Img Lower Unilateral Left  Result Date: 03/09/2019 CLINICAL DATA:  58 year old female with a history thigh pain EXAM: LEFT LOWER EXTREMITY VENOUS DOPPLER ULTRASOUND TECHNIQUE: Gray-scale sonography with graded compression, as well as color Doppler and duplex ultrasound were performed to evaluate the lower extremity deep venous systems from the level of the common femoral vein and including the common femoral, femoral, profunda femoral, popliteal  and calf veins including the posterior tibial, peroneal and gastrocnemius veins when visible. The superficial great saphenous vein was also interrogated. Spectral Doppler was utilized to evaluate flow at rest and with distal augmentation maneuvers in the common femoral, femoral and popliteal veins. COMPARISON:  None. FINDINGS: Contralateral Common Femoral Vein: Respiratory phasicity is normal and symmetric with the symptomatic side. No evidence of thrombus. Normal compressibility. Common Femoral Vein: No evidence of thrombus. Normal compressibility, respiratory phasicity and response to augmentation. Saphenofemoral Junction: No evidence of thrombus. Normal compressibility and flow on color Doppler imaging. Profunda Femoral Vein: No evidence of thrombus. Normal compressibility and flow on color Doppler imaging. Femoral Vein: No evidence of thrombus. Normal compressibility, respiratory phasicity and response to augmentation. Popliteal Vein: No evidence of thrombus. Normal compressibility, respiratory phasicity and response to augmentation. Calf Veins: No evidence of thrombus. Normal compressibility and flow on color Doppler imaging. Superficial Great Saphenous Vein: No evidence of thrombus. Normal compressibility and flow on color Doppler imaging. Other Findings:  None. IMPRESSION:  Sonographic survey of the left lower extremity negative for DVT Electronically Signed   By: Corrie Mckusick D.O.   On: 03/09/2019 12:20   ____________________________________________   PROCEDURES Procedures     INITIAL IMPRESSION / ASSESSMENT AND PLAN / ED COURSE  Pertinent labs & imaging results that were available during my care of the patient were reviewed by me and considered in my medical decision making (see chart for details).  Well-appearing patient.  No acute distress.  Left thigh pain for close to 2 weeks, no injury.  Has no trauma will defer x-ray, patient agrees.  Left lower extremity venous ultrasound as above per  radiologist, negative for left lower extremity DVT.  Concern for muscular injury.  Counseled regarding ice, heat, compression, support and will treat with Mobic.  Patient states that she tolerates NSAIDs well as long she takes it with food, encourage this.  Follow-up with orthopedic for continued pain.Discussed indication, risks and benefits of medications with patient.  Discussed follow up with Primary care physician this week. Discussed follow up and return parameters including no resolution or any worsening concerns. Patient verbalized understanding and agreed to plan.   ____________________________________________   FINAL CLINICAL IMPRESSION(S) / ED DIAGNOSES  Final diagnoses:  Left thigh pain     ED Discharge Orders         Ordered    US Venous Img Lower Unilateral Right  Status:  Canceled     03/09/19 1023    US Venous Img Lower Unilateral Left     03/09/19 1055    meloxicam (MOBIC) 15 MG tablet  Daily PRN     03/09/19 1218           Note: This dictation was prepared with Dragon dictation along with smaller phrase technology. Any transcriptional errors that result from this process are unintentional.         Marylene Land, NP 03/09/19 1242

## 2019-06-05 ENCOUNTER — Other Ambulatory Visit: Payer: Self-pay

## 2019-06-05 ENCOUNTER — Ambulatory Visit
Admission: EM | Admit: 2019-06-05 | Discharge: 2019-06-05 | Disposition: A | Discharge status: Home / Self Care | Payer: BLUE CROSS/BLUE SHIELD | Attending: Urgent Care | Admitting: Urgent Care

## 2019-06-05 DIAGNOSIS — J069 Acute upper respiratory infection, unspecified: Secondary | ICD-10-CM

## 2019-06-05 DIAGNOSIS — Z20828 Contact with and (suspected) exposure to other viral communicable diseases: Secondary | ICD-10-CM

## 2019-06-05 DIAGNOSIS — Z20822 Contact with and (suspected) exposure to covid-19: Secondary | ICD-10-CM

## 2019-06-05 NOTE — Discharge Instructions (Signed)
It was very nice seeing you today in clinic. Thank you for entrusting me with your care.   Rest and Stay HYDRATED. Water and electrolyte containing beverages (Gatorade, Pedialyte) are best to prevent dehydration and electrolyte abnormalities. May use Tylenol and/or Ibuprofen as needed for headache. May use over the counter cough medication if needed.   You were tested for SARS-CoV-2 (novel coronavirus) today. Testing is performed by an outside lab ([facility]) and has variable turn around times ranging between 2-5 days. Current recommendations from the the CDC and  DHHS require that you remain out of work in order to quarantine at home until negative test results are have been received. In the event that your test results are positive, you will be contacted with further directives. These measures are being implemented out of an abundance of caution to prevent transmission and spread during the current SARS-CoV-2 pandemic.  Make arrangements to follow up with your regular doctor in 1 week for re-evaluation if not improving.  If your symptoms/condition worsens, please seek follow up care either here or in the ER. Please remember, our [facility] providers are "right here with you" when you need [place].   Again, it was my pleasure to take care of you today. Thank you for choosing our clinic. I hope that you start to feel better quickly.   Honor Loh, MSN, APRN, FNP-C, CEN Advanced Practice Provider [facility]

## 2019-06-05 NOTE — ED Provider Notes (Signed)
[place], [place]   Name: Felicia Meyer DOB: Nov 02, 1960 MRN: [id number] CSN: [id number] PCP: Maryland Pink, MD  Arrival date and time:  06/05/19 1359  Chief Complaint:  Nasal Congestion   NOTE: Prior to seeing the patient today, I have reviewed the triage nursing documentation and vital signs. Clinical staff has updated patient's PMH/PSHx, current medication list, and drug allergies/intolerances to ensure comprehensive history available to assist in medical decision making.   History:   HPI: [name] is a 58 y.o. female who presents today with complaints of cough, sore throat, rhinorrhea, and a generalized headache that started approximately 3 days ago. Patient denies any associated fevers or shortness of breath. Cough has been non-productive. She denies myalgias, facial pain, and otalgia. She denies that she has experienced any nausea, vomiting, diarrhea, or abdominal pain. She is eating and drinking well. Patient denies any perceived alterations to her sense of taste or smell. Patient presents with concerns related to possible SARS-CoV-2 (novel coronavirus) infection citing that her niece tested positive on 06/01/2019 and a coworker tested positive on 06/03/2019. Patient has been in direct contact with both of these individuals. She has never been tested for SARS-CoV-2 (novel coronavirus) in the past per his report. Patient has not been vaccinated for influenza as of yet this season. In efforts to conservatively manage her symptoms at home, the patient notes that she has used APAP, which has helped to improve her headache some.    Past Medical History:  Diagnosis Date   ADHD    Anxiety    Arthritis    Fibromyalgia    GERD (gastroesophageal reflux disease)    Hypertension     Past Surgical History:  Procedure Laterality Date   CARPAL TUNNEL RELEASE Right 09/22/2017   Procedure: CARPAL TUNNEL RELEASE;  Surgeon: Hessie Knows, MD;  Location: [facility] ORS;  Service:  Orthopedics;  Laterality: Right;   DILATION AND CURETTAGE OF UTERUS      Family History  Problem Relation Age of Onset   Breast cancer Maternal Aunt 28   Cancer Mother        leukemia   Hypertension Father    CAD Father     Social History   Tobacco Use   Smoking status: Never Smoker   Smokeless tobacco: Never Used  Substance Use Topics   Alcohol use: No   Drug use: No    There are no active problems to display for this patient.   Home Medications:    Current Meds  Medication Sig   ALPRAZolam (XANAX) 0.25 MG tablet Take 0.25 mg by mouth daily as needed for anxiety.   amphetamine-dextroamphetamine (ADDERALL XR) 25 MG 24 hr capsule Take 25 mg by mouth daily.   atenolol (TENORMIN) 50 MG tablet Take 50 mg by mouth every morning.    ibuprofen (ADVIL,MOTRIN) 200 MG tablet Take 600 mg by mouth every 8 (eight) hours as needed (for pain.).   omeprazole (PRILOSEC) 20 MG capsule Take 20 mg by mouth as needed.    Allergies:   Ibuprofen, Lactose intolerance (gi), and Codeine  Review of Systems (ROS): Review of Systems  Constitutional: Negative for fatigue and fever.  HENT: Positive for congestion, postnasal drip, rhinorrhea and sore throat. Negative for ear pain, sinus pressure, sinus pain and sneezing.   Eyes: Negative for pain, discharge and redness.  Respiratory: Positive for cough. Negative for chest tightness and shortness of breath.   Cardiovascular: Negative for chest pain and palpitations.  Gastrointestinal: Negative for abdominal pain,  diarrhea, nausea and vomiting.  Musculoskeletal: Negative for arthralgias, back pain, myalgias and neck pain.  Skin: Negative for color change, pallor and rash.  Neurological: Negative for dizziness, syncope, weakness and headaches.  Hematological: Negative for adenopathy.     Vital Signs: Today's Vitals   06/05/19 1428 06/05/19 1429 06/05/19 1431 06/05/19 1509  BP:   (!) 142/91   Pulse:   85   Resp:   16   Temp:    98 F (36.7 C)   TempSrc:   Oral   SpO2:   100%   Weight:  165 lb (74.8 kg)    Height:  5\' 4"  (1.626 m)    PainSc: 4    4     Physical Exam: Physical Exam  Constitutional: She is oriented to person, place, and time and well-developed, well-nourished, and in no distress.  HENT:  Head: Normocephalic and atraumatic.  Right Ear: Tympanic membrane normal.  Left Ear: Tympanic membrane normal.  Nose: Rhinorrhea present. No mucosal edema or sinus tenderness.  Mouth/Throat: Uvula is midline and mucous membranes are normal. Posterior oropharyngeal erythema (+) clear PND present. No oropharyngeal exudate or posterior oropharyngeal edema.  Eyes: Pupils are equal, round, and reactive to light.  Neck: Normal range of motion. Neck supple.  Cardiovascular: Normal rate, regular rhythm, normal heart sounds and intact distal pulses.  Pulmonary/Chest: Effort normal and breath sounds normal.  Minor dry sounding cough in clinic. SPO2 100% on RA.   Musculoskeletal: Normal range of motion.  Neurological: She is alert and oriented to person, place, and time. Gait normal.  Skin: Skin is warm and dry. No rash noted. She is not diaphoretic.  Psychiatric: Mood, memory, affect and judgment normal.  Nursing note and vitals reviewed.   Urgent Care Treatments / Results:  LABS: PLEASE NOTE: all labs that were ordered this encounter are listed, however only abnormal results are displayed. Labs Reviewed  NOVEL CORONAVIRUS, NAA (HOSP ORDER, SEND-OUT TO REF LAB; TAT 18-24 HRS)    EKG: -None  RADIOLOGY: No results found.  PROCEDURES: Procedures  MEDICATIONS RECEIVED THIS VISIT: Medications - No data to display  PERTINENT CLINICAL COURSE NOTES/UPDATES:   Initial Impression / Assessment and Plan / Urgent Care Course:  Pertinent labs & imaging results that were available during my care of the patient were personally reviewed by me and considered in my medical decision making (see lab/imaging section of  note for values and interpretations).  [name] is a 58 y.o. female who [facility] with complaints of Nasal Congestion   Patient overall well appearing and in no acute distress today in clinic. Presenting symptoms (see HPI) and exam as documented above. She presents with symptoms associated with SARS-CoV-2 (novel coronavirus). Discussed typical symptom constellation. Reviewed potential for infection and need for testing. Patient amenable to being tested. SARS-CoV-2 swab collected by certified clinical staff. Discussed variable turn around times associated with testing, as swabs are being processed at [facility], and have been taking between 2-5 days to come back. She was advised to self quarantine, per [facility] guidelines, until negative results received.   Presenting symptoms consistent with acute viral illness. Until ruled out with confirmatory lab testing, SARS-CoV-2 remains part of the differential. Her testing is pending at this time. I discussed with her that her symptoms are felt to be viral in nature, thus antibiotics would not offer her any relief or improve his symptoms any faster than conservative symptomatic management. Discussed supportive care measures at  home during acute phase of illness. Patient to rest as much as possible. She was encouraged to ensure adequate hydration (water and ORS) to prevent dehydration and electrolyte derangements. Patient may use APAP and/or IBU on an as needed basis for pain/fever. Offered medication to help with cough, however patient declined. Recommended warm salt water gargles and lozenges to help soothe throat.   Current clinical condition warrants patient being out of work in order to quarantine while waiting for testing results. She was provided with the appropriate documentation to provide to her place of employment that will allow for her to RTW on 06/08/2019 with no restrictions. RTW is contingent on her SARS-CoV-2 test  results being reviewed as negative. These measures are being implemented out of an abundance of caution to prevent transmission and spread during the current SARS-CoV-2 pandemic.  Discussed follow up with primary care physician in 1 week for re-evaluation. I have reviewed the follow up and strict return precautions for any new or worsening symptoms. Patient is aware of symptoms that would be deemed urgent/emergent, and would thus require further evaluation either here or in the emergency department. At the time of discharge, she verbalized understanding and consent with the discharge plan as it was reviewed with her. All questions were fielded by provider and/or clinic staff prior to patient discharge.    Final Clinical Impressions / Urgent Care Diagnoses:   Final diagnoses:  Viral URI with cough  Close exposure to COVID-19 virus  Encounter for laboratory testing for COVID-19 virus    New Prescriptions:  [place] Controlled Substance Registry consulted? Not Applicable  No orders of the defined types were placed in this encounter.   Recommended Follow up Care:  Patient encouraged to follow up with the following provider within the specified time frame, or sooner as dictated by the severity of her symptoms. As always, she was instructed that for any urgent/emergent care needs, she should seek care either here or in the emergency department for more immediate evaluation.  Follow-up Information    PCP In 1 week.   Why: General reassessment of symptoms if not improving        NOTE: This note was prepared using Scientist, clinical (histocompatibility and immunogenetics) along with smaller Lobbyist. Despite my best ability to proofread, there is the potential that transcriptional errors may still occur from this process, and are completely unintentional.    Verlee Monte, NP 06/05/19 1845

## 2019-06-05 NOTE — ED Triage Notes (Signed)
Patient states that her 2 co-workers are positive for [employer]. Patient states that one of them in her niece. Patient states that she started having nasal congestion and sore throat on Wednesday. Patient states that she was told to come get swabbed.

## 2019-06-06 LAB — NOVEL CORONAVIRUS, NAA (HOSP ORDER, SEND-OUT TO REF LAB; TAT 18-24 HRS): SARS-CoV-2, NAA: NOT DETECTED

## 2019-06-09 ENCOUNTER — Emergency Department
Admission: EM | Admit: 2019-06-09 | Discharge: 2019-06-09 | Disposition: A | Discharge status: Home / Self Care | Payer: BLUE CROSS/BLUE SHIELD | Attending: Emergency Medicine | Admitting: Emergency Medicine

## 2019-06-09 ENCOUNTER — Encounter: Payer: Self-pay | Admitting: Emergency Medicine

## 2019-06-09 ENCOUNTER — Other Ambulatory Visit: Payer: Self-pay

## 2019-06-09 DIAGNOSIS — R4182 Altered mental status, unspecified: Secondary | ICD-10-CM

## 2019-06-09 DIAGNOSIS — I10 Essential (primary) hypertension: Secondary | ICD-10-CM | POA: Insufficient documentation

## 2019-06-09 DIAGNOSIS — Z79899 Other long term (current) drug therapy: Secondary | ICD-10-CM | POA: Diagnosis not present

## 2019-06-09 DIAGNOSIS — F10920 Alcohol use, unspecified with intoxication, uncomplicated: Secondary | ICD-10-CM | POA: Insufficient documentation

## 2019-06-09 LAB — BASIC METABOLIC PANEL
Anion gap: 9 (ref 5–15)
BUN: 21 mg/dL — ABNORMAL HIGH (ref 6–20)
CO2: 22 mmol/L (ref 22–32)
Calcium: 8.9 mg/dL (ref 8.9–10.3)
Chloride: 108 mmol/L (ref 98–111)
Creatinine, Ser: 0.71 mg/dL (ref 0.44–1.00)
GFR calc Af Amer: >60 mL/min (ref >60)
GFR calc non Af Amer: >60 mL/min (ref >60)
Glucose, Bld: 106 mg/dL — ABNORMAL HIGH (ref 70–99)
Potassium: 4.2 mmol/L (ref 3.5–5.1)
Sodium: 139 mmol/L (ref 135–145)

## 2019-06-09 LAB — CBC WITH DIFFERENTIAL/PLATELET
Abs Immature Granulocytes: 0.03 10*3/uL (ref 0.00–0.07)
Basophils Absolute: 0 10*3/uL (ref 0.0–0.1)
Basophils Relative: 1 %
Eosinophils Absolute: 0.1 10*3/uL (ref 0.0–0.5)
Eosinophils Relative: 1 %
HCT: 40.3 % (ref 36.0–46.0)
Hemoglobin: 13.5 g/dL (ref 12.0–15.0)
Immature Granulocytes: 0 %
Lymphocytes Relative: 32 %
Lymphs Abs: 2.9 10*3/uL (ref 0.7–4.0)
MCH: 28.4 pg (ref 26.0–34.0)
MCHC: 33.5 g/dL (ref 30.0–36.0)
MCV: 84.8 fL (ref 80.0–100.0)
Monocytes Absolute: 0.8 10*3/uL (ref 0.1–1.0)
Monocytes Relative: 9 %
Neutro Abs: 5 10*3/uL (ref 1.7–7.7)
Neutrophils Relative %: 57 %
Platelets: 241 10*3/uL (ref 150–400)
RBC: 4.75 MIL/uL (ref 3.87–5.11)
RDW: 12.9 % (ref 11.5–15.5)
WBC: 8.9 10*3/uL (ref 4.0–10.5)
nRBC: 0 % (ref 0.0–0.2)

## 2019-06-09 LAB — ETHANOL: Alcohol, Ethyl (B): 167 mg/dL — ABNORMAL HIGH (ref <10)

## 2019-06-09 MED ORDER — LACTATED RINGERS IV BOLUS
1000.0000 mL | Freq: Once | INTRAVENOUS | Status: AC
  Administered 2019-06-09: 1000 mL via INTRAVENOUS

## 2019-06-09 NOTE — ED Provider Notes (Signed)
[facility] Emergency Department Provider Note   ____________________________________________   First MD Initiated Contact with Patient 06/09/19 2052     (approximate)  I have reviewed the triage vital signs and the nursing notes.   HISTORY  Chief Complaint Altered Mental Status and Alcohol Intoxication    HPI Felicia Meyer is a 58 y.o. female with past medical history of hypertension, GERD, and anxiety who presents to the ED for altered mental status.  EMS states that they were called to patient's residence after family found her minimally responsive in bed.  Family was able to arouse patient after EMS arrived and she seemed at her baseline with no complaints.  Patient admits that she had more alcohol to drink than usual tonight.  She states that she has been depressed recently due to the loss of multiple family members earlier this year.  She states she has been feeling well and denies any recent fevers, cough, chest pain, or shortness of breath.  She denies any drug use.  She admits to a depressed mood, but denies any suicidal ideation.        Past Medical History:  Diagnosis Date  . ADHD   . Anxiety   . Arthritis   . Fibromyalgia   . GERD (gastroesophageal reflux disease)   . Hypertension     There are no problems to display for this patient.   Past Surgical History:  Procedure Laterality Date  . CARPAL TUNNEL RELEASE Right 09/22/2017   Procedure: CARPAL TUNNEL RELEASE;  Surgeon: Kennedy Bucker, MD;  Location: [facility] ORS;  Service: Orthopedics;  Laterality: Right;  . DILATION AND CURETTAGE OF UTERUS      Prior to Admission medications   Medication Sig Start Date End Date Taking? Authorizing Provider  ALPRAZolam Prudy Feeler) 0.25 MG tablet Take 0.25 mg by mouth daily as needed for anxiety.    [provider]  amphetamine-dextroamphetamine (ADDERALL XR) 25 MG 24 hr capsule Take 25 mg by mouth daily.    [provider]    atenolol (TENORMIN) 50 MG tablet Take 50 mg by mouth every morning.     [provider]  ibuprofen (ADVIL,MOTRIN) 200 MG tablet Take 600 mg by mouth every 8 (eight) hours as needed (for pain.).    [provider]  omeprazole (PRILOSEC) 20 MG capsule Take 20 mg by mouth as needed.    [provider]    Allergies Ibuprofen, Lactose intolerance (gi), and Codeine  Family History  Problem Relation Age of Onset  . Breast cancer Maternal Aunt 70  . Cancer Mother        leukemia  . Hypertension Father   . CAD Father     Social History Social History   Tobacco Use  . Smoking status: Never Smoker  . Smokeless tobacco: Never Used  Substance Use Topics  . Alcohol use: Yes  . Drug use: No    Review of Systems  Constitutional: No fever/chills. Eyes: No visual changes. ENT: No sore throat. Cardiovascular: Denies chest pain. Respiratory: Denies shortness of breath. Gastrointestinal: No abdominal pain.  No nausea, no vomiting.  No diarrhea.  No constipation. Genitourinary: Negative for dysuria. Musculoskeletal: Negative for back pain. Skin: Negative for rash. Neurological: Negative for headaches, focal weakness or numbness.  Positive for depressed mood.  ____________________________________________   PHYSICAL EXAM:  VITAL SIGNS: ED Triage Vitals  Enc Vitals Group     BP --      Pulse Rate 06/09/19 2050 78  Resp 06/09/19 2050 18     Temp 06/09/19 2050 98.7 F (37.1 C)     Temp Source 06/09/19 2050 Oral     SpO2 06/09/19 2050 100 %     Weight 06/09/19 2051 165 lb 5.5 oz (75 kg)     Height 06/09/19 2051 5\' 4"  (1.626 m)     Head Circumference --      Peak Flow --      Pain Score 06/09/19 2051 0     Pain Loc --      Pain Edu? --      Excl. in [place]? --     Constitutional: Alert and oriented.  Intoxicated appearing. Eyes: Conjunctivae are normal. Head: Atraumatic. Nose: No congestion/rhinnorhea. Mouth/Throat: Mucous membranes are  moist. Neck: Normal ROM Cardiovascular: Normal rate, regular rhythm. Grossly normal heart sounds. Respiratory: Normal respiratory effort.  No retractions. Lungs CTAB. Gastrointestinal: Soft and nontender. No distention. Genitourinary: deferred Musculoskeletal: No lower extremity tenderness nor edema. Neurologic:  Normal speech and language. No gross focal neurologic deficits are appreciated. Skin:  Skin is warm, dry and intact. No rash noted. Psychiatric: Mood and affect are normal. Speech and behavior are normal.  ____________________________________________   LABS (all labs ordered are listed, but only abnormal results are displayed)  Labs Reviewed  BASIC METABOLIC PANEL - Abnormal; Notable for the following components:      Result Value   Glucose, Bld 106 (*)    BUN 21 (*)    All other components within normal limits  ETHANOL - Abnormal; Notable for the following components:   Alcohol, Ethyl (B) 167 (*)    All other components within normal limits  CBC WITH DIFFERENTIAL/PLATELET   ____________________________________________  EKG  ED ECG REPORT I, Blake Divine, the attending physician, personally viewed and interpreted this ECG.   Date: 06/09/2019  EKG Time: 20:51  Rate: 75  Rhythm: normal sinus rhythm  Axis: Normal  Intervals:none  ST&T Change: None   PROCEDURES  Procedure(s) performed (including Critical Care):  Procedures   ____________________________________________   INITIAL IMPRESSION / ASSESSMENT AND PLAN / ED COURSE       58 year old female presents to the ED where family found her in bed appearing altered and difficult to arouse.  She is now awake and alert upon arrival to the ED and has no focal neurologic deficits.  EKG shows no evidence of arrhythmia or ischemia and she denies any chest pain or shortness of breath.  I suspect her episode today was secondary to alcohol intoxication as she admits to drinking more than usual this evening.  We  will check labs, including alcohol level, give IV fluid bolus and continue to monitor.  Lab work is reassuring, significant only for elevated blood alcohol.  Patient's daughter is not present at bedside and relates that patient has been under numerous stressors lately, agrees that episode likely related to alcohol consumption.  We will provide with referral to psychiatry for evaluation as an outpatient, also counseled patient to follow-up with her PCP.  Patient and daughter agree with plan for discharge.      ____________________________________________   FINAL CLINICAL IMPRESSION(S) / ED DIAGNOSES  Final diagnoses:  Alcoholic intoxication without complication ([place])  Altered mental status, unspecified altered mental status type     ED Discharge Orders    None       Note:  This document was prepared using Dragon voice recognition software and may include unintentional dictation errors.   Blake Divine, MD 06/09/19 2326

## 2019-06-09 NOTE — ED Triage Notes (Signed)
Patient arrives EMS after family called 911 for "period of unresponsiveness". Patient endorses drinking alcohol tonight and does not drink usually. Patient had 3 glasses of wine; patient has experienced many losses this year and says she feels "a little depressed" and feels like she has to be strong for everyone else in her family. Patient vehemently denies SI/HI, says she will never hurt herself, she just wanted to relax tonight and "be calm". Said she "just wanted to go to sleep"

## 2019-06-09 NOTE — ED Notes (Signed)
Patient ambulated to bathroom without difficulty, minimal assistance. Patient's daughter at bedside

## 2019-10-25 ENCOUNTER — Other Ambulatory Visit: Payer: Self-pay | Admitting: Family Medicine

## 2019-10-25 DIAGNOSIS — Z1231 Encounter for screening mammogram for malignant neoplasm of breast: Secondary | ICD-10-CM

## 2019-10-31 ENCOUNTER — Ambulatory Visit
Admission: RE | Admit: 2019-10-31 | Discharge: 2019-10-31 | Disposition: A | Discharge status: Home / Self Care | Payer: BLUE CROSS/BLUE SHIELD | Source: Ambulatory Visit | Attending: Family Medicine | Admitting: Family Medicine

## 2019-10-31 DIAGNOSIS — Z1231 Encounter for screening mammogram for malignant neoplasm of breast: Secondary | ICD-10-CM | POA: Insufficient documentation

## 2020-09-09 IMAGING — MG DIGITAL SCREENING BILAT W/ TOMO W/ CAD
6 of 10 series · 6 of 30 positions shown · non-contrast
Comparison: Previous exam(s).

CLINICAL DATA: Screening.

EXAM:
DIGITAL SCREENING BILATERAL MAMMOGRAM WITH TOMO AND CAD

[R MLO synth-2D]
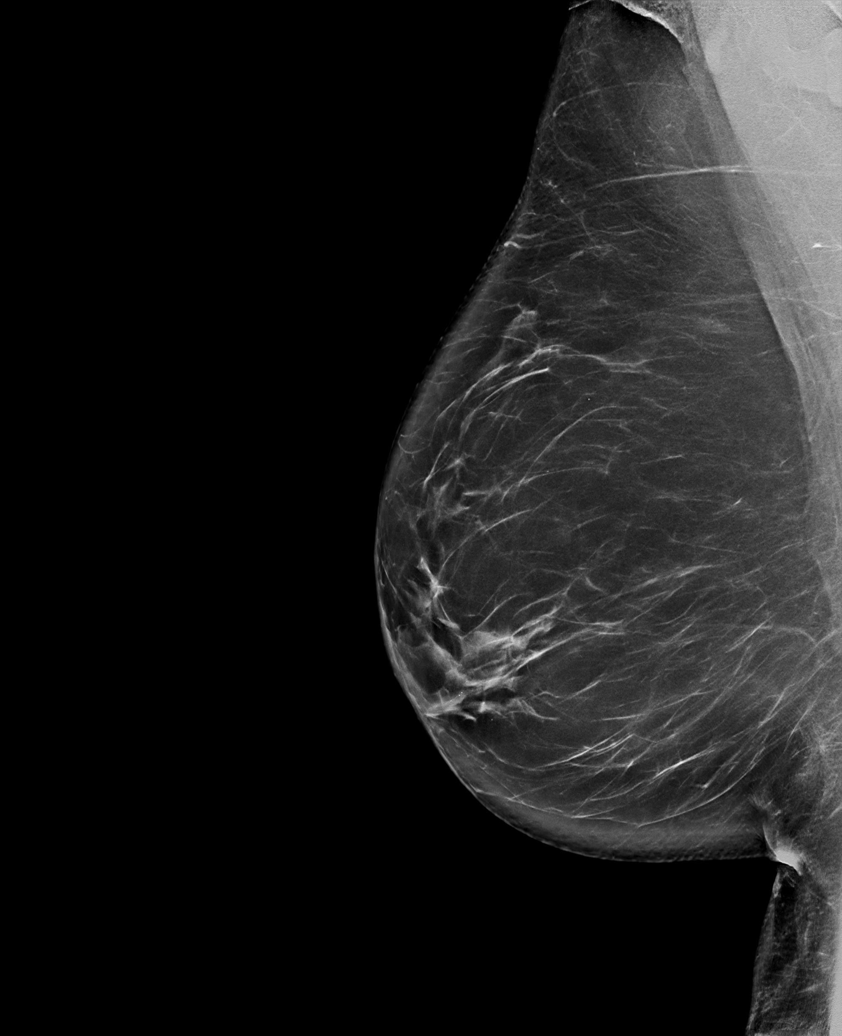

[R CC synth-2D]
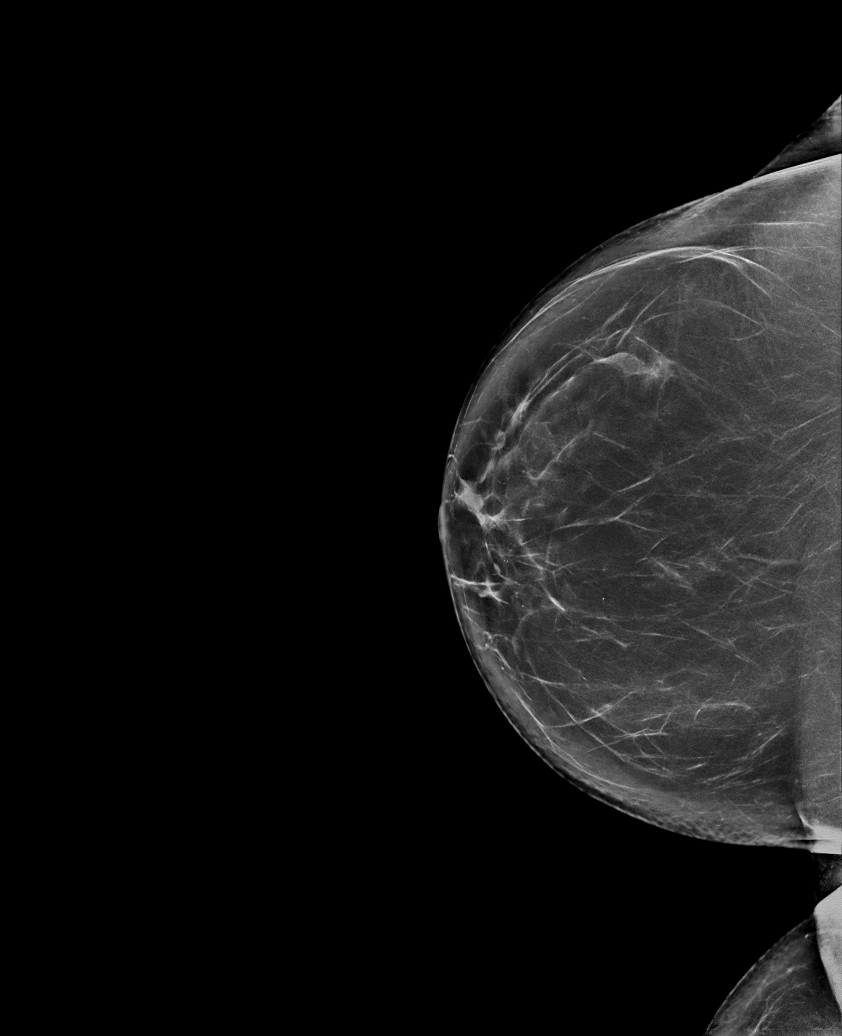

[L CC synth-2D]
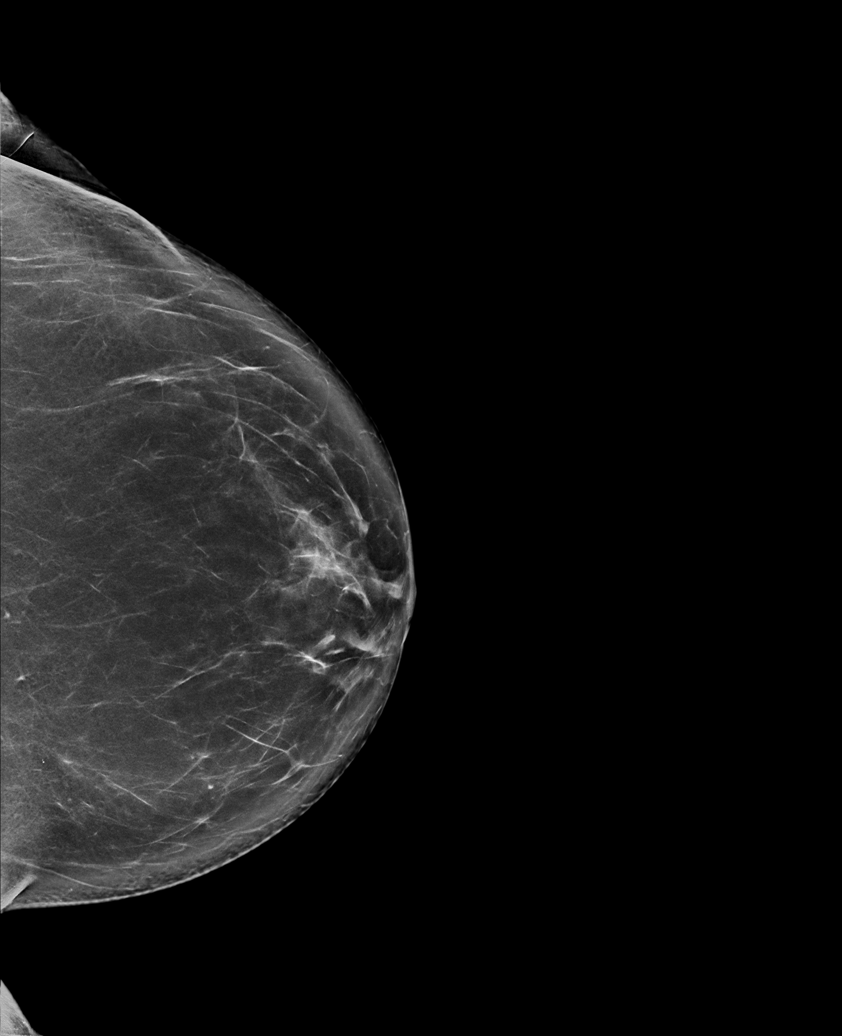

[L MLO synth-2D (1 of 2)]
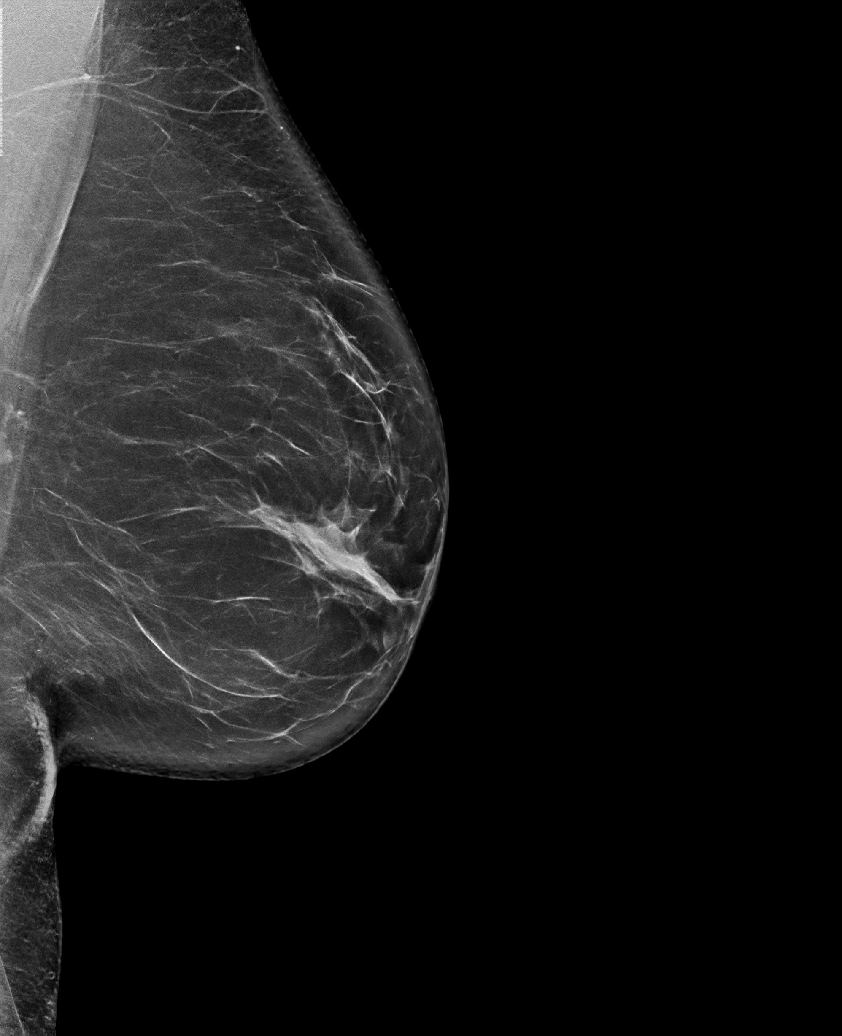

[L MLO synth-2D (2 of 2)]
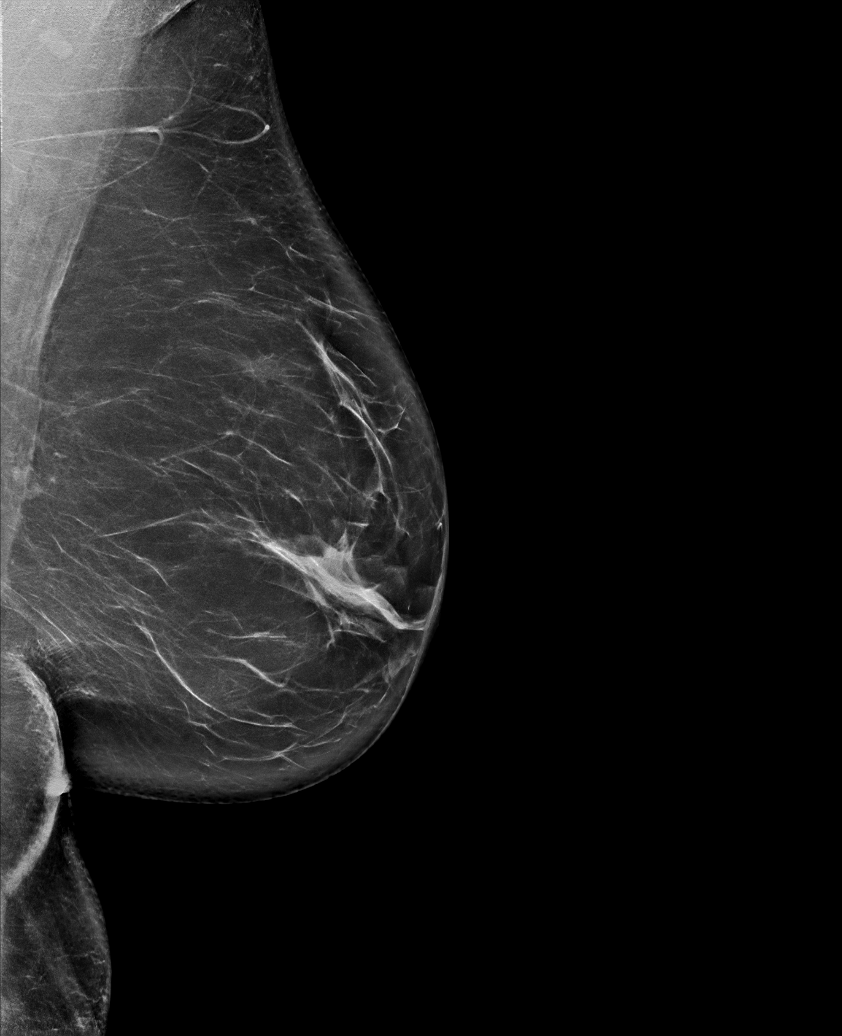

[L MLO tomo · tomo slice 45/90.0]
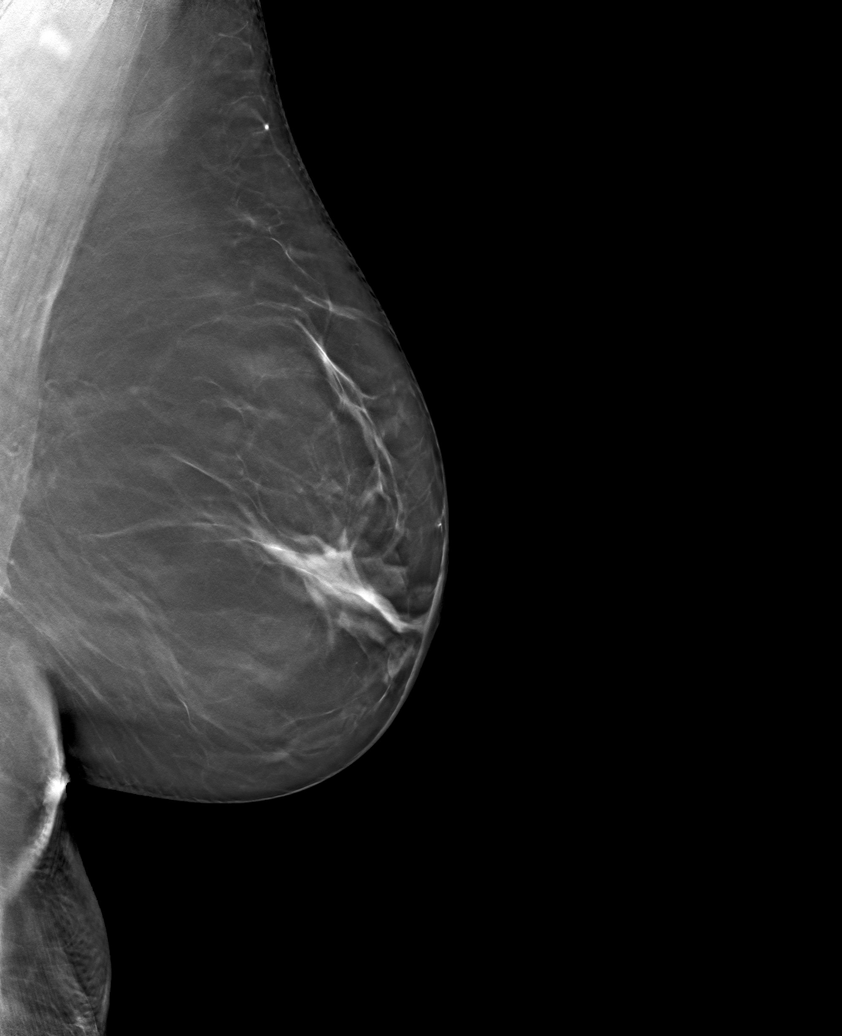

[6 of 30 positions shown; findings below may reference images not displayed]

ACR Breast Density Category b: There are scattered areas of
fibroglandular density.
FINDINGS: There are no findings suspicious for malignancy. Images were
processed with CAD.
IMPRESSION: No mammographic evidence of malignancy. A result letter of this
screening mammogram will be mailed directly to the patient.

RECOMMENDATION:
Screening mammogram in one year. (Code:[id number])

BI-RADS CATEGORY  1: Negative.

## 2021-03-26 ENCOUNTER — Other Ambulatory Visit: Payer: Self-pay | Admitting: Family Medicine

## 2021-03-26 DIAGNOSIS — Z1231 Encounter for screening mammogram for malignant neoplasm of breast: Secondary | ICD-10-CM

## 2021-04-15 ENCOUNTER — Ambulatory Visit: Payer: BLUE CROSS/BLUE SHIELD

## 2021-05-20 ENCOUNTER — Ambulatory Visit: Payer: Self-pay

## 2021-12-15 ENCOUNTER — Ambulatory Visit
Admission: RE | Admit: 2021-12-15 | Discharge: 2021-12-15 | Disposition: A | Discharge status: Home / Self Care | Payer: 59 | Source: Ambulatory Visit

## 2021-12-15 ENCOUNTER — Ambulatory Visit (INDEPENDENT_AMBULATORY_CARE_PROVIDER_SITE_OTHER): Payer: 59

## 2021-12-15 VITALS — BP 150/104 | HR 85 | Temp 98.2°F | Resp 14 | Ht 64.0 in | Wt 155.0 lb

## 2021-12-15 DIAGNOSIS — M25571 Pain in right ankle and joints of right foot: Secondary | ICD-10-CM

## 2021-12-15 DIAGNOSIS — S92031A Displaced avulsion fracture of tuberosity of right calcaneus, initial encounter for closed fracture: Secondary | ICD-10-CM | POA: Diagnosis not present

## 2021-12-15 DIAGNOSIS — W109XXA Fall (on) (from) unspecified stairs and steps, initial encounter: Secondary | ICD-10-CM | POA: Diagnosis not present

## 2021-12-15 NOTE — ED Provider Notes (Signed)
[facility]    CSN: [id number] Arrival date & time: 12/15/21  1255      History   Chief Complaint Chief Complaint  Patient presents with   Foot Injury   Fall    HPI Felicia Meyer is a 61 y.o. female.   HPI  61 year old female here for evaluation of right foot ankle pain.  Patient reports that she was walking down steps yesterday morning and she missed the last step causing her to fall on the concrete.  She states that since the fall she has been able to bear weight on her right foot she has swelling and bruising to the outside of her right foot and ankle.  She denies any numbness or tingling in her toes.  She does have pain with range of motion.  Past Medical History:  Diagnosis Date   ADHD    Anxiety    Arthritis    Fibromyalgia    GERD (gastroesophageal reflux disease)    Hypertension     There are no problems to display for this patient.   Past Surgical History:  Procedure Laterality Date   CARPAL TUNNEL RELEASE Right 09/22/2017   Procedure: CARPAL TUNNEL RELEASE;  Surgeon: Kennedy Bucker, MD;  Location: [facility] ORS;  Service: Orthopedics;  Laterality: Right;   DILATION AND CURETTAGE OF UTERUS      OB History   No obstetric history on file.      Home Medications    Prior to Admission medications   Medication Sig Start Date End Date Taking? Authorizing Provider  amphetamine-dextroamphetamine (ADDERALL XR) 25 MG 24 hr capsule Take 25 mg by mouth daily.   Yes [provider]  aspirin EC 81 MG tablet Take 81 mg by mouth daily. Swallow whole.   Yes [provider]  atenolol (TENORMIN) 50 MG tablet Take 50 mg by mouth every morning.    Yes [provider]  omeprazole (PRILOSEC) 20 MG capsule Take 20 mg by mouth as needed.   Yes [provider]  ALPRAZolam (XANAX) 0.25 MG tablet Take 0.25 mg by mouth daily as needed for anxiety.    [provider]  ibuprofen (ADVIL,MOTRIN) 200 MG tablet Take 600 mg by  mouth every 8 (eight) hours as needed (for pain.).    [provider]    Family History Family History  Problem Relation Age of Onset   Breast cancer Maternal Aunt 79   Cancer Mother        leukemia   Hypertension Father    CAD Father     Social History Social History   Tobacco Use   Smoking status: Never   Smokeless tobacco: Never  Vaping Use   Vaping Use: Never used  Substance Use Topics   Alcohol use: Yes   Drug use: No     Allergies   Ibuprofen, Lactose intolerance (gi), and Codeine   Review of Systems Review of Systems  Constitutional:  Negative for fever.  Musculoskeletal:  Positive for arthralgias, joint swelling and myalgias.  Skin:  Positive for color change. Negative for wound.  Neurological:  Negative for weakness and numbness.  Hematological: Negative.   Psychiatric/Behavioral: Negative.       Physical Exam Triage Vital Signs ED Triage Vitals  Enc Vitals Group     BP 12/15/21 1320 (!) 150/104     Pulse Rate 12/15/21 1320 85     Resp 12/15/21 1320 14     Temp 12/15/21 1320 98.2 F (36.8 C)  Temp Source 12/15/21 1320 Oral     SpO2 12/15/21 1320 98 %     Weight 12/15/21 1318 155 lb (70.3 kg)     Height 12/15/21 1318 5\' 4"  (1.626 m)     Head Circumference --      Peak Flow --      Pain Score 12/15/21 1318 7     Pain Loc --      Pain Edu? --      Excl. in [place]? --    No data found.  Updated Vital Signs BP (!) 150/104 (BP Location: Left Arm)   Pulse 85   Temp 98.2 F (36.8 C) (Oral)   Resp 14   Ht 5\' 4"  (1.626 m)   Wt 155 lb (70.3 kg)   SpO2 98%   BMI 26.61 kg/m   Visual Acuity Right Eye Distance:   Left Eye Distance:   Bilateral Distance:    Right Eye Near:   Left Eye Near:    Bilateral Near:     Physical Exam Vitals and nursing note reviewed.  Constitutional:      Appearance: Normal appearance. She is not ill-appearing.  HENT:     Head: Normocephalic and atraumatic.  Musculoskeletal:        General:  Swelling, tenderness and signs of injury present. No deformity.  Skin:    General: Skin is warm and dry.     Capillary Refill: Capillary refill takes less than 2 seconds.     Findings: Bruising present. No erythema.  Neurological:     General: No focal deficit present.     Mental Status: She is alert and oriented to person, place, and time.  Psychiatric:        Mood and Affect: Mood normal.        Behavior: Behavior normal.        Thought Content: Thought content normal.        Judgment: Judgment normal.      UC Treatments / Results  Labs (all labs ordered are listed, but only abnormal results are displayed) Labs Reviewed - No data to display  EKG   Radiology DG Foot Complete Right  Result Date: 12/15/2021 CLINICAL DATA:  Fall down steps yesterday. Right foot injury and pain. EXAM: RIGHT FOOT COMPLETE - 3+ VIEW COMPARISON:  None Available. FINDINGS: A small avulsion fracture fragment is seen along the lateral aspect of the distal calcaneus, which is of indeterminate age. No other fractures are identified. No evidence of dislocation. 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 Prominent plantar calcaneal bone spur also noted. IMPRESSION: Small avulsion fracture fragment along the lateral aspect of the distal calcaneus, which is of indeterminate age. Recommend clinical correlation for point tenderness at this site. Electronically Signed   By: Marlaine Hind M.D.   On: 12/15/2021 14:02   DG Ankle Complete Right  Result Date: 12/15/2021 CLINICAL DATA:  Fall down steps yesterday.  Right ankle pain. EXAM: RIGHT ANKLE - COMPLETE 3+ VIEW COMPARISON:  None Available. FINDINGS: There is no evidence of fracture, dislocation, or joint effusion. There is no evidence of arthropathy. Plantar calcaneal bone spur noted. Soft tissues are unremarkable. IMPRESSION: No acute findings. Electronically Signed   By: Marlaine Hind M.D.   On: 12/15/2021 14:00    Procedures Procedures (including  critical care time)  Medications Ordered in UC Medications - No data to display  Initial Impression / Assessment and Plan / UC Course  I have reviewed the triage vital signs  and the nursing notes.  Pertinent labs & imaging results that were available during my care of the patient were reviewed by me and considered in my medical decision making (see chart for details).  Patient is a nontoxic-appearing 61 year old female here for evaluation of right foot and ankle pain that has been going on since yesterday morning after she missed her last up and fell.  Since that time she has been able to bear weight.  She denies any numbness tingling toes.  On exam patient's right foot and ankle are normal anatomical alignment.  There is edema and ecchymosis to the lateral aspect of the right ankle as well as the lateral and middle midfoot to the level of the distal metatarsals.  She does not have pain with compression of the medial or lateral malleolus, calcaneus, or arch.  She has full sensation range of motion of her toes.  She does have tenderness with palpation over the anterior talofibular ligament and the proximal lateral midfoot.  This tenderness extends on out through the tarsal bones bones into the metatarsals of the third, fourth, and fifth toes.  She does have tenderness with compression of the base of the fifth metatarsal as well.  Her cap refill is less than 2 seconds.  We will obtain radiographs of the right foot and ankle.  Right ankle films independently reviewed and evaluated by me.  Impression: There is no evidence of fracture or dislocation of either medial or lateral malleolus.  The mortise joint is maintained.  There is soft tissue swelling inferior to the fibula.  Patient does have a plantar calcaneal spur.  Radiology overread is pending. Radiology reports that there is no evidence of fracture, dislocation, or joint effusion.  No evidence of arthropathy.  Plantar calcaneal bone spur noted.  Soft  tissues unremarkable.  No acute findings.  Right foot films independently reviewed and evaluated by me.  Impression: There is no evidence of fracture or dislocation.  The lateral soft tissues overlying the proximal and lateral midfoot are edematous.  Again a calcaneal spur is visible in the lateral view.  Radiology overread is pending. Radiology findings state small avulsion fracture fragment seen along the lateral aspect of the distal calcaneus which is of indeterminate age.  No other fractures identified.  No evidence of dislocation.  Mild osteoarthritis involving the MTP joint.  I will place the patient in a short leg posterior splint and crutches and have her follow-up with orthopedics.     Final Clinical Impressions(s) / UC Diagnoses   Final diagnoses:  Closed displaced avulsion fracture of tuberosity of right calcaneus, initial encounter     Discharge Instructions      Wear the splint at all times to protect your foot from further injury.  Use your crutches to help you with mobility.  Keep your right foot elevated is much as possible to help decrease swelling and aid in pain relief.  Use over-the-counter Tylenol and ibuprofen according to package instructions as needed for pain.  Apply ice to your foot over top of the splint for 20 minutes at a time 2-3 times a day.  I have referred you to orthopedics for evaluation of your calcaneal fracture.     ED Prescriptions   None    PDMP not reviewed this encounter.   Becky Augusta, NP 12/15/21 1414

## 2021-12-15 NOTE — ED Triage Notes (Signed)
Patient states that she missed her last step and fell down yesterday morning.  Patient c/o pain in her right ankle and foot.

## 2021-12-15 NOTE — Discharge Instructions (Addendum)
Wear the splint at all times to protect your foot from further injury.  Use your crutches to help you with mobility.  Keep your right foot elevated is much as possible to help decrease swelling and aid in pain relief.  Use over-the-counter Tylenol and ibuprofen according to package instructions as needed for pain.  Apply ice to your foot over top of the splint for 20 minutes at a time 2-3 times a day.  I have referred you to orthopedics for evaluation of your calcaneal fracture.

## 2022-11-10 ENCOUNTER — Encounter: Payer: Self-pay | Admitting: Emergency Medicine

## 2022-11-10 ENCOUNTER — Ambulatory Visit
Admission: EM | Admit: 2022-11-10 | Discharge: 2022-11-10 | Disposition: A | Discharge status: Home / Self Care | Payer: 59 | Attending: Family Medicine | Admitting: Family Medicine

## 2022-11-10 DIAGNOSIS — F419 Anxiety disorder, unspecified: Secondary | ICD-10-CM | POA: Diagnosis not present

## 2022-11-10 DIAGNOSIS — R0789 Other chest pain: Secondary | ICD-10-CM | POA: Diagnosis not present

## 2022-11-10 DIAGNOSIS — I10 Essential (primary) hypertension: Secondary | ICD-10-CM | POA: Diagnosis not present

## 2022-11-10 LAB — CBC WITH DIFFERENTIAL/PLATELET
Abs Immature Granulocytes: 0.02 10*3/uL (ref 0.00–0.07)
Basophils Absolute: 0.1 10*3/uL (ref 0.0–0.1)
Basophils Relative: 1 %
Eosinophils Absolute: 0.2 10*3/uL (ref 0.0–0.5)
Eosinophils Relative: 2 %
HCT: 40.8 % (ref 36.0–46.0)
Hemoglobin: 13.8 g/dL (ref 12.0–15.0)
Immature Granulocytes: 0 %
Lymphocytes Relative: 41 %
Lymphs Abs: 2.7 10*3/uL (ref 0.7–4.0)
MCH: 28.5 pg (ref 26.0–34.0)
MCHC: 33.8 g/dL (ref 30.0–36.0)
MCV: 84.1 fL (ref 80.0–100.0)
Monocytes Absolute: 0.5 10*3/uL (ref 0.1–1.0)
Monocytes Relative: 8 %
Neutro Abs: 3.2 10*3/uL (ref 1.7–7.7)
Neutrophils Relative %: 48 %
Platelets: 253 10*3/uL (ref 150–400)
RBC: 4.85 MIL/uL (ref 3.87–5.11)
RDW: 13.2 % (ref 11.5–15.5)
WBC: 6.7 10*3/uL (ref 4.0–10.5)
nRBC: 0 % (ref 0.0–0.2)

## 2022-11-10 LAB — BASIC METABOLIC PANEL
Anion gap: 4 — ABNORMAL LOW (ref 5–15)
BUN: 17 mg/dL (ref 8–23)
CO2: 26 mmol/L (ref 22–32)
Calcium: 9 mg/dL (ref 8.9–10.3)
Chloride: 106 mmol/L (ref 98–111)
Creatinine, Ser: 0.72 mg/dL (ref 0.44–1.00)
GFR, Estimated: >60 mL/min (ref >60)
Glucose, Bld: 103 mg/dL — ABNORMAL HIGH (ref 70–99)
Potassium: 4.2 mmol/L (ref 3.5–5.1)
Sodium: 136 mmol/L (ref 135–145)

## 2022-11-10 LAB — TROPONIN I (HIGH SENSITIVITY): Troponin I (High Sensitivity): 2 ng/L (ref <18)

## 2022-11-10 NOTE — ED Provider Notes (Signed)
[facility]    CSN: [id number] Arrival date & time: 11/10/22  1057      History   Chief Complaint Chief Complaint  Patient presents with   Dizziness    Elevated blood pressure - Entered by patient   Chest Pain   Hypertension   Headache    HPI Felicia Meyer is a 62 y.o. female.   HPI   Felicia Meyer presents for left sided chest pain under her breathe with dizziness and elevated blood pressure. Pain not worse with movement or eating. Started over the weekend. She takes Xanax and the chest discomfort goes away.  She gets scared that something wrong and worries about the health of her daughter and grand kids. She is taking care of her 85 yo and 68 yo grand kids. She is not taking Lexapro as she doesn't like taking medications. Her PCP recently increased her blood pressure medication. Her home blood pressure range has been 140-170 / 97-111.   Patient Denies: Nausea, Vomiting, Diaphoresis, Shortness of breath, Pleuritic pain, Leg swelling, Syncope, Heartburn/food sticking, Recent immobility, Wheezing, Hemoptysis, Trauma, Fever, and Cough  Pt reports no history of PE, DVT, DM,  cancer, recent surgery   Tobacco use no     Past Medical History:  Diagnosis Date   ADHD    Anxiety    Arthritis    Fibromyalgia    GERD (gastroesophageal reflux disease)    Hypertension     There are no problems to display for this patient.   Past Surgical History:  Procedure Laterality Date   CARPAL TUNNEL RELEASE Right 09/22/2017   Procedure: CARPAL TUNNEL RELEASE;  Surgeon: Kennedy Bucker, MD;  Location: [facility] OR[facility];  Service: Orthopedics;  Laterality: Right;   DILATION AND CURETTAGE OF UTERUS      OB History   No obstetric history on file.      Home Medications    Prior to Admission medications   Medication Sig Start Date End Date Taking? Authorizing Provider  losartan (COZAAR) 50 MG tablet Take 1 tablet by mouth daily. 10/06/22 10/06/23 Yes [provider]   ALPRAZolam Prudy Feeler) 0.25 MG tablet Take 0.25 mg by mouth daily as needed for anxiety.    [provider]  amphetamine-dextroamphetamine (ADDERALL XR) 25 MG 24 hr capsule Take 25 mg by mouth daily.    [provider]  aspirin EC 81 MG tablet Take 81 mg by mouth daily. Swallow whole.    [provider]  atenolol (TENORMIN) 50 MG tablet Take 50 mg by mouth every morning.     [provider]  ibuprofen (ADVIL,MOTRIN) 200 MG tablet Take 600 mg by mouth every 8 (eight) hours as needed (for pain.).    [provider]  omeprazole (PRILOSEC) 20 MG capsule Take 20 mg by mouth as needed.    [provider]    Family History Family History  Problem Relation Age of Onset   Breast cancer Maternal Aunt 27   Cancer Mother        leukemia   Hypertension Father    CAD Father     Social History Social History   Tobacco Use   Smoking status: Never   Smokeless tobacco: Never  Vaping Use   Vaping Use: Never used  Substance Use Topics   Alcohol use: Yes   Drug use: No     Allergies   Ibuprofen, Lactose intolerance (gi), Aspirin, and Codeine   Review of Systems Review of Systems: negative unless otherwise stated  in HPI.      Physical Exam Triage Vital Signs ED Triage Vitals [11/10/22 1225]  Enc Vitals Group     BP (!) 180/101     Pulse Rate 70     Resp 16     Temp (!) 97.4 F (36.3 C)     Temp Source Oral     SpO2 97 %     Weight      Height      Head Circumference      Peak Flow      Pain Score 4     Pain Loc      Pain Edu?      Excl. in [place]?    No data found.  Updated Vital Signs BP (!) 166/100 (BP Location: Left Arm)   Pulse 70   Temp (!) 97.4 F (36.3 C) (Oral)   Resp 16   SpO2 97%   Visual Acuity Right Eye Distance:   Left Eye Distance:   Bilateral Distance:    Right Eye Near:   Left Eye Near:    Bilateral Near:     Physical Exam GEN:     alert, well appearing female,  HENT:  mucus membranes moist,  nares patent, no nasal discharge  EYES:   pupils equal and reactive, EOM intact NECK:  supple, normal ROM,  RESP:  clear to auscultation bilaterally, no increased work of breathing  CVS:   regular rate and rhythm, no murmur, distal pulses intact   EXT:   normal ROM, atraumatic, no edema  NEURO:  alert, oriented, speech normal, CN 2-12 grossly intact, no facial droop,  sensation grossly intact, strength 5/5 bilateral UE and LE, normal coordination Skin:   warm and dry, no rash, normal skin turgor Psych:  tearful when discussing her family stressors, normal affect, appropriate speech and behavior      UC Treatments / Results  Labs (all labs ordered are listed, but only abnormal results are displayed) Labs Reviewed  BASIC METABOLIC PANEL - Abnormal; Notable for the following components:      Result Value   Glucose, Bld 103 (*)    Anion gap 4 (*)    All other components within normal limits  CBC WITH DIFFERENTIAL/PLATELET  TROPONIN I (HIGH SENSITIVITY)    EKG  If EKG performed, see my interpretation in the MDM section  Radiology No results found.   Procedures Procedures (including critical care time)  Medications Ordered in UC Medications - No data to display  Initial Impression / Assessment and Plan / UC Course  I have reviewed the triage vital signs and the nursing notes.  Pertinent labs & imaging results that were available during my care of the patient were reviewed by me and considered in my medical decision making (see chart for details).       Patient is a 62 y.o. female  with anxiety and HTN who presents for left sided chest pain .  Overall patient is nontoxic-appearing and afebrile.   Differential diagnosis includes, but is not limited to, ACS, aortic dissection, pulmonary embolism, cardiac tamponade, pneumothorax, pneumonia, pericarditis/myocarditis, GI-related causes including esophagitis/gastritis, and musculoskeletal chest wall pain, anxiety, anemia,  hypothyroidism    Felicia Meyer is hypertensive here.  BP 180/101 then after sitting was 166/100.  I suspect high blood pressure issue is secondary to uncontrolled anxitey. Takes Losartan and Atenolol.  Denies needing refills. Recommended she check his blood pressure daily and keep follow up appointment with her primary care provider.  Jodell has no history of PE.  She is satting well on room and and is not tachycardic. Not able to [facility] out due to age but doubt PE.  EKG  personally interpreted by me obtained and is without ST elevations or concern for ischemia, NSR, no PR interval elongation. High sensitivity Troponin is 2.   Chest x-ray deferred. I do not think her chest pain is cardiopulmonary related.  CBC unremarkable for anemia.  There were no electrolyte abnormalitis seen on CMP. There is no evidence of costochondritis and pain is not reproducible on exam.  This does not appear to be MSK related.    Suspect uncontrolled anxiety as pt taking Adderall for ADHD which can cause anxiety and pain goes away with Xanax.  She is not taking an SSRI/SNRI.  Reviewed psychology today website and patient is interested in therapy.  Commended that she establish care again with a psychiatrist as she is taking Adderall which can cause anxiety and she is not on an SSRI.  ED and return precautions given and patient/guardian voiced understanding. Discussed MDM, treatment plan and plan for follow-up with patient who agrees with plan.     Final Clinical Impressions(s) / UC Diagnoses   Final diagnoses:  Hypertension, unspecified type  Anxiety  Atypical chest pain     Discharge Instructions      Your blood work did not indicate the cause of your chest pain. It does not appear your heart. As discussed, follow up with a psychologist like on the website psychology today and also a psychiatrist for medication management.   Follow-up with your primary care provider about your high blood pressure.  Continue to  take your blood pressures daily.  Your kidney function is normal.     ED Prescriptions   None    PDMP not reviewed this encounter.   Katha Cabal, DO 11/10/22 1359

## 2022-11-10 NOTE — ED Triage Notes (Addendum)
Pt presents with left side chest pain, dizziness, headache and hypertension for a while per patient. She was seen at her PCP and her blood pressure medication was adjusted. Today she tried to get in with her PCP and advised to come to urgent care.

## 2022-11-10 NOTE — Discharge Instructions (Addendum)
Your blood work did not indicate the cause of your chest pain. It does not appear your heart. As discussed, follow up with a psychologist like on the website psychology today and also a psychiatrist for medication management.   Follow-up with your primary care provider about your high blood pressure.  Continue to take your blood pressures daily.  Your kidney function is normal.

## 2023-01-09 ENCOUNTER — Ambulatory Visit: Payer: BC Managed Care – PPO

## 2023-01-09 DIAGNOSIS — Z1211 Encounter for screening for malignant neoplasm of colon: Secondary | ICD-10-CM | POA: Diagnosis not present

## 2023-01-09 DIAGNOSIS — K5730 Diverticulosis of large intestine without perforation or abscess without bleeding: Secondary | ICD-10-CM | POA: Diagnosis not present
